# Patient Record
Sex: Female | Born: 1955 | Race: White | Hispanic: No | Smoking: Never smoker
Health system: Southern US, Community
[De-identification: ages and names within clinical notes are randomized; demographics above are authoritative.]

## PROBLEM LIST (undated history)

## (undated) DIAGNOSIS — I10 Essential (primary) hypertension: Secondary | ICD-10-CM

## (undated) DIAGNOSIS — K5790 Diverticulosis of intestine, part unspecified, without perforation or abscess without bleeding: Secondary | ICD-10-CM

## (undated) DIAGNOSIS — J45909 Unspecified asthma, uncomplicated: Secondary | ICD-10-CM

## (undated) DIAGNOSIS — Z853 Personal history of malignant neoplasm of breast: Secondary | ICD-10-CM

## (undated) DIAGNOSIS — E7800 Pure hypercholesterolemia, unspecified: Secondary | ICD-10-CM

## (undated) HISTORY — DX: Personal history of malignant neoplasm of breast: Z85.3

## (undated) SURGERY — ERCP, WITH INTERVENTION IF INDICATED
Anesthesia: Monitor Anesthesia Care

---

## 2003-06-04 DIAGNOSIS — Z853 Personal history of malignant neoplasm of breast: Secondary | ICD-10-CM

## 2003-06-04 HISTORY — DX: Personal history of malignant neoplasm of breast: Z85.3

## 2003-06-04 HISTORY — PX: BILATERAL TOTAL MASTECTOMY WITH AXILLARY LYMPH NODE DISSECTION: SHX6364

## 2014-12-12 HISTORY — PX: CHOLECYSTECTOMY: SHX55

## 2014-12-16 ENCOUNTER — Inpatient Hospital Stay (HOSPITAL_COMMUNITY)
Admission: EM | Admit: 2014-12-16 | Discharge: 2014-12-20 | DRG: 393 | Disposition: A | Discharge status: Rehabilitation Facility | Payer: Medicaid Other | Source: Other Acute Inpatient Hospital | Attending: Internal Medicine | Admitting: Internal Medicine

## 2014-12-16 DIAGNOSIS — Z825 Family history of asthma and other chronic lower respiratory diseases: Secondary | ICD-10-CM

## 2014-12-16 DIAGNOSIS — Z9049 Acquired absence of other specified parts of digestive tract: Secondary | ICD-10-CM | POA: Diagnosis present

## 2014-12-16 DIAGNOSIS — K913 Postprocedural intestinal obstruction: Secondary | ICD-10-CM | POA: Diagnosis present

## 2014-12-16 DIAGNOSIS — J45909 Unspecified asthma, uncomplicated: Secondary | ICD-10-CM | POA: Diagnosis present

## 2014-12-16 DIAGNOSIS — E780 Pure hypercholesterolemia: Secondary | ICD-10-CM | POA: Diagnosis present

## 2014-12-16 DIAGNOSIS — K838 Other specified diseases of biliary tract: Secondary | ICD-10-CM | POA: Diagnosis present

## 2014-12-16 DIAGNOSIS — K9189 Other postprocedural complications and disorders of digestive system: Principal | ICD-10-CM | POA: Diagnosis present

## 2014-12-16 DIAGNOSIS — R52 Pain, unspecified: Secondary | ICD-10-CM

## 2014-12-16 DIAGNOSIS — R0902 Hypoxemia: Secondary | ICD-10-CM | POA: Diagnosis present

## 2014-12-16 DIAGNOSIS — J9811 Atelectasis: Secondary | ICD-10-CM | POA: Diagnosis present

## 2014-12-16 DIAGNOSIS — I10 Essential (primary) hypertension: Secondary | ICD-10-CM | POA: Diagnosis present

## 2014-12-16 DIAGNOSIS — K7689 Other specified diseases of liver: Secondary | ICD-10-CM | POA: Diagnosis present

## 2014-12-16 DIAGNOSIS — R5381 Other malaise: Secondary | ICD-10-CM | POA: Diagnosis not present

## 2014-12-16 DIAGNOSIS — D62 Acute posthemorrhagic anemia: Secondary | ICD-10-CM | POA: Diagnosis not present

## 2014-12-16 DIAGNOSIS — K929 Disease of digestive system, unspecified: Secondary | ICD-10-CM | POA: Diagnosis not present

## 2014-12-16 DIAGNOSIS — K653 Choleperitonitis: Secondary | ICD-10-CM | POA: Diagnosis present

## 2014-12-16 DIAGNOSIS — E876 Hypokalemia: Secondary | ICD-10-CM | POA: Diagnosis present

## 2014-12-16 DIAGNOSIS — I509 Heart failure, unspecified: Secondary | ICD-10-CM | POA: Diagnosis not present

## 2014-12-16 DIAGNOSIS — K567 Ileus, unspecified: Secondary | ICD-10-CM | POA: Diagnosis present

## 2014-12-16 HISTORY — DX: Pure hypercholesterolemia, unspecified: E78.00

## 2014-12-16 HISTORY — DX: Unspecified asthma, uncomplicated: J45.909

## 2014-12-16 HISTORY — DX: Diverticulosis of intestine, part unspecified, without perforation or abscess without bleeding: K57.90

## 2014-12-16 HISTORY — DX: Essential (primary) hypertension: I10

## 2014-12-16 MED ORDER — CHLORHEXIDINE GLUCONATE 0.12 % MT SOLN
15.0000 mL | Freq: Two times a day (BID) | OROMUCOSAL | Status: DC
  Administered 2014-12-17 – 2014-12-20 (×5): 15 mL via OROMUCOSAL
  Filled 2014-12-16 (×11): qty 15

## 2014-12-16 MED ORDER — CETYLPYRIDINIUM CHLORIDE 0.05 % MT LIQD
7.0000 mL | Freq: Two times a day (BID) | OROMUCOSAL | Status: DC
  Administered 2014-12-18 – 2014-12-19 (×3): 7 mL via OROMUCOSAL

## 2014-12-16 NOTE — Progress Notes (Signed)
Pt arrived to 3s via [facility]. NS infusing at 125cc/hr. Pt. Alert and Oriented x4. VSS on 2LNC. Perc drained 200cc brown fluid. Glasses at bedside. Pt. C/o pain to the abdomen 9/10. Awaiting attending MD orders. Will continue to monitor Pt.

## 2014-12-17 ENCOUNTER — Encounter (HOSPITAL_COMMUNITY)
Admission: EM | Disposition: A | Discharge status: Rehabilitation Facility | Payer: Self-pay | Source: Other Acute Inpatient Hospital | Attending: Internal Medicine

## 2014-12-17 ENCOUNTER — Encounter (HOSPITAL_COMMUNITY): Payer: Self-pay | Admitting: Internal Medicine

## 2014-12-17 ENCOUNTER — Inpatient Hospital Stay (HOSPITAL_COMMUNITY): Payer: Medicaid Other | Admitting: Anesthesiology

## 2014-12-17 ENCOUNTER — Inpatient Hospital Stay (HOSPITAL_COMMUNITY): Payer: Medicaid Other

## 2014-12-17 DIAGNOSIS — K913 Postprocedural intestinal obstruction: Secondary | ICD-10-CM

## 2014-12-17 DIAGNOSIS — R0902 Hypoxemia: Secondary | ICD-10-CM | POA: Insufficient documentation

## 2014-12-17 DIAGNOSIS — I10 Essential (primary) hypertension: Secondary | ICD-10-CM

## 2014-12-17 DIAGNOSIS — K9189 Other postprocedural complications and disorders of digestive system: Secondary | ICD-10-CM

## 2014-12-17 DIAGNOSIS — K567 Ileus, unspecified: Secondary | ICD-10-CM | POA: Diagnosis present

## 2014-12-17 HISTORY — PX: ERCP: SHX5425

## 2014-12-17 LAB — COMPREHENSIVE METABOLIC PANEL
ALBUMIN: 1.9 g/dL — AB (ref 3.5–5.0)
ALK PHOS: 60 U/L (ref 38–126)
ALT: 10 U/L — AB (ref 14–54)
ANION GAP: 8 (ref 5–15)
AST: 14 U/L — ABNORMAL LOW (ref 15–41)
BUN: 12 mg/dL (ref 6–20)
CO2: 29 mmol/L (ref 22–32)
Calcium: 7.9 mg/dL — ABNORMAL LOW (ref 8.9–10.3)
Chloride: 102 mmol/L (ref 101–111)
Creatinine, Ser: 0.65 mg/dL (ref 0.44–1.00)
GFR calc non Af Amer: >60 mL/min (ref >60)
GLUCOSE: 117 mg/dL — AB (ref 65–99)
POTASSIUM: 3.1 mmol/L — AB (ref 3.5–5.1)
SODIUM: 139 mmol/L (ref 135–145)
TOTAL PROTEIN: 5.2 g/dL — AB (ref 6.5–8.1)
Total Bilirubin: 3.7 mg/dL — ABNORMAL HIGH (ref 0.3–1.2)

## 2014-12-17 LAB — GLUCOSE, CAPILLARY
GLUCOSE-CAPILLARY: 127 mg/dL — AB (ref 65–99)
Glucose-Capillary: 110 mg/dL — ABNORMAL HIGH (ref 65–99)
Glucose-Capillary: 120 mg/dL — ABNORMAL HIGH (ref 65–99)
Glucose-Capillary: 99 mg/dL (ref 65–99)

## 2014-12-17 LAB — CBC WITH DIFFERENTIAL/PLATELET
Basophils Absolute: 0 10*3/uL (ref 0.0–0.1)
Basophils Relative: 0 % (ref 0–1)
EOS ABS: 0 10*3/uL (ref 0.0–0.7)
Eosinophils Relative: 0 % (ref 0–5)
HCT: 39.5 % (ref 36.0–46.0)
Hemoglobin: 13.7 g/dL (ref 12.0–15.0)
LYMPHS ABS: 0.7 10*3/uL (ref 0.7–4.0)
Lymphocytes Relative: 5 % — ABNORMAL LOW (ref 12–46)
MCH: 31.4 pg (ref 26.0–34.0)
MCHC: 34.7 g/dL (ref 30.0–36.0)
MCV: 90.6 fL (ref 78.0–100.0)
MONO ABS: 1.2 10*3/uL — AB (ref 0.1–1.0)
MONOS PCT: 9 % (ref 3–12)
NEUTROS ABS: 11.1 10*3/uL — AB (ref 1.7–7.7)
NEUTROS PCT: 86 % — AB (ref 43–77)
PLATELETS: 193 10*3/uL (ref 150–400)
RBC: 4.36 MIL/uL (ref 3.87–5.11)
RDW: 13.8 % (ref 11.5–15.5)
WBC: 13 10*3/uL — ABNORMAL HIGH (ref 4.0–10.5)

## 2014-12-17 LAB — PHOSPHORUS: Phosphorus: 2.1 mg/dL — ABNORMAL LOW (ref 2.5–4.6)

## 2014-12-17 LAB — MAGNESIUM: MAGNESIUM: 2 mg/dL (ref 1.7–2.4)

## 2014-12-17 LAB — MRSA PCR SCREENING: MRSA by PCR: NEGATIVE

## 2014-12-17 LAB — URINALYSIS, ROUTINE W REFLEX MICROSCOPIC
Glucose, UA: NEGATIVE mg/dL
Ketones, ur: 40 mg/dL — AB
Nitrite: NEGATIVE
Protein, ur: 100 mg/dL — AB
SPECIFIC GRAVITY, URINE: 1.036 — AB (ref 1.005–1.030)
Urobilinogen, UA: 0.2 mg/dL (ref 0.0–1.0)
pH: 6 (ref 5.0–8.0)

## 2014-12-17 LAB — TSH: TSH: 3.701 u[IU]/mL (ref 0.350–4.500)

## 2014-12-17 LAB — URINE MICROSCOPIC-ADD ON

## 2014-12-17 LAB — GRAM STAIN: Gram Stain: NONE SEEN

## 2014-12-17 LAB — TROPONIN I
TROPONIN I: 0.03 ng/mL (ref <0.031)
Troponin I: 0.08 ng/mL — ABNORMAL HIGH (ref <0.031)
Troponin I: 0.18 ng/mL — ABNORMAL HIGH (ref <0.031)

## 2014-12-17 LAB — LIPASE, BLOOD: Lipase: 20 U/L — ABNORMAL LOW (ref 22–51)

## 2014-12-17 LAB — LACTIC ACID, PLASMA: Lactic Acid, Venous: 1.3 mmol/L (ref 0.5–2.0)

## 2014-12-17 LAB — PROTIME-INR
INR: 1.15 (ref 0.00–1.49)
Prothrombin Time: 14.9 seconds (ref 11.6–15.2)

## 2014-12-17 SURGERY — ERCP, WITH INTERVENTION IF INDICATED
Anesthesia: General

## 2014-12-17 MED ORDER — SODIUM CHLORIDE 0.9 % IV SOLN
INTRAVENOUS | Status: DC
  Administered 2014-12-17: 02:00:00 via INTRAVENOUS

## 2014-12-17 MED ORDER — LACTATED RINGERS IV SOLN
INTRAVENOUS | Status: DC | PRN
  Administered 2014-12-17: 12:00:00 via INTRAVENOUS

## 2014-12-17 MED ORDER — PROPOFOL 10 MG/ML IV BOLUS
INTRAVENOUS | Status: DC | PRN
  Administered 2014-12-17: 100 mg via INTRAVENOUS

## 2014-12-17 MED ORDER — PANTOPRAZOLE SODIUM 40 MG IV SOLR
40.0000 mg | Freq: Every day | INTRAVENOUS | Status: DC
  Administered 2014-12-17 – 2014-12-18 (×3): 40 mg via INTRAVENOUS
  Filled 2014-12-17 (×5): qty 40

## 2014-12-17 MED ORDER — POTASSIUM CHLORIDE 10 MEQ/100ML IV SOLN
10.0000 meq | INTRAVENOUS | Status: DC
  Administered 2014-12-17 (×3): 10 meq via INTRAVENOUS
  Filled 2014-12-17 (×2): qty 100

## 2014-12-17 MED ORDER — FUROSEMIDE 10 MG/ML IJ SOLN
20.0000 mg | Freq: Once | INTRAMUSCULAR | Status: AC
  Administered 2014-12-17: 20 mg via INTRAVENOUS
  Filled 2014-12-17: qty 2

## 2014-12-17 MED ORDER — SODIUM CHLORIDE 0.9 % IJ SOLN
3.0000 mL | Freq: Two times a day (BID) | INTRAMUSCULAR | Status: DC
  Administered 2014-12-17 – 2014-12-19 (×4): 3 mL via INTRAVENOUS

## 2014-12-17 MED ORDER — SUCCINYLCHOLINE CHLORIDE 20 MG/ML IJ SOLN
INTRAMUSCULAR | Status: DC | PRN
  Administered 2014-12-17: 80 mg via INTRAVENOUS

## 2014-12-17 MED ORDER — LIDOCAINE HCL (CARDIAC) 20 MG/ML IV SOLN
INTRAVENOUS | Status: DC | PRN
  Administered 2014-12-17: 100 mg via INTRAVENOUS

## 2014-12-17 MED ORDER — SODIUM CHLORIDE 0.9 % IV SOLN
INTRAVENOUS | Status: DC
  Administered 2014-12-17 – 2014-12-18 (×3): via INTRAVENOUS

## 2014-12-17 MED ORDER — SODIUM CHLORIDE 0.9 % IV SOLN
INTRAVENOUS | Status: DC | PRN
  Administered 2014-12-17: 15 mL

## 2014-12-17 MED ORDER — ACETAMINOPHEN 325 MG PO TABS
650.0000 mg | ORAL_TABLET | Freq: Four times a day (QID) | ORAL | Status: DC | PRN
  Administered 2014-12-17: 650 mg via ORAL
  Filled 2014-12-17: qty 2

## 2014-12-17 MED ORDER — ALBUTEROL SULFATE (2.5 MG/3ML) 0.083% IN NEBU
3.0000 mL | INHALATION_SOLUTION | Freq: Four times a day (QID) | RESPIRATORY_TRACT | Status: DC | PRN
Start: 2014-12-17 — End: 2014-12-20

## 2014-12-17 MED ORDER — HYDROMORPHONE HCL 1 MG/ML IJ SOLN
0.5000 mg | INTRAMUSCULAR | Status: DC | PRN
  Administered 2014-12-17 – 2014-12-20 (×13): 0.5 mg via INTRAVENOUS
  Filled 2014-12-17 (×13): qty 1

## 2014-12-17 MED ORDER — METOCLOPRAMIDE HCL 5 MG/ML IJ SOLN
5.0000 mg | Freq: Three times a day (TID) | INTRAMUSCULAR | Status: DC
Start: 2014-12-17 — End: 2014-12-17
  Administered 2014-12-17 (×2): 5 mg via INTRAVENOUS
  Filled 2014-12-17 (×5): qty 1

## 2014-12-17 MED ORDER — SODIUM CHLORIDE 0.9 % IV SOLN: INTRAVENOUS | Status: DC

## 2014-12-17 MED ORDER — ONDANSETRON HCL 4 MG/2ML IJ SOLN: 4.0000 mg | Freq: Four times a day (QID) | INTRAMUSCULAR | Status: DC | PRN

## 2014-12-17 MED ORDER — ASPIRIN 300 MG RE SUPP
150.0000 mg | Freq: Every day | RECTAL | Status: DC
  Filled 2014-12-17 (×2): qty 1

## 2014-12-17 MED ORDER — PIPERACILLIN-TAZOBACTAM 3.375 G IVPB 30 MIN
3.3750 g | Freq: Once | INTRAVENOUS | Status: AC
  Administered 2014-12-17: 3.375 g via INTRAVENOUS
  Filled 2014-12-17: qty 50

## 2014-12-17 MED ORDER — PHENYLEPHRINE HCL 10 MG/ML IJ SOLN
10.0000 mg | INTRAVENOUS | Status: DC | PRN
  Administered 2014-12-17: 60 ug/min via INTRAVENOUS

## 2014-12-17 MED ORDER — HYDROMORPHONE HCL 1 MG/ML IJ SOLN
0.5000 mg | INTRAMUSCULAR | Status: DC | PRN
  Administered 2014-12-17 (×2): 0.5 mg via INTRAVENOUS

## 2014-12-17 MED ORDER — HYDROMORPHONE HCL 1 MG/ML IJ SOLN
INTRAMUSCULAR | Status: AC
  Administered 2014-12-17: 0.5 mg via INTRAVENOUS
  Filled 2014-12-17: qty 1

## 2014-12-17 MED ORDER — ONDANSETRON HCL 4 MG PO TABS: 4.0000 mg | ORAL_TABLET | Freq: Four times a day (QID) | ORAL | Status: DC | PRN

## 2014-12-17 MED ORDER — PIPERACILLIN-TAZOBACTAM 3.375 G IVPB
3.3750 g | Freq: Three times a day (TID) | INTRAVENOUS | Status: DC
  Administered 2014-12-17 – 2014-12-20 (×11): 3.375 g via INTRAVENOUS
  Filled 2014-12-17 (×16): qty 50

## 2014-12-17 MED ORDER — ONDANSETRON HCL 4 MG/2ML IJ SOLN
INTRAMUSCULAR | Status: DC | PRN
  Administered 2014-12-17: 4 mg via INTRAVENOUS

## 2014-12-17 MED ORDER — HYDROMORPHONE HCL 1 MG/ML IJ SOLN: 0.5000 mg | INTRAMUSCULAR | Status: DC | PRN

## 2014-12-17 MED ORDER — ACETAMINOPHEN 650 MG RE SUPP
650.0000 mg | Freq: Four times a day (QID) | RECTAL | Status: DC | PRN
  Filled 2014-12-17: qty 1

## 2014-12-17 MED ORDER — HYDRALAZINE HCL 20 MG/ML IJ SOLN: 10.0000 mg | Freq: Four times a day (QID) | INTRAMUSCULAR | Status: DC | PRN

## 2014-12-17 MED ORDER — LACTATED RINGERS IV SOLN
INTRAVENOUS | Status: DC
  Administered 2014-12-17: 11:00:00 via INTRAVENOUS

## 2014-12-17 MED ORDER — HEPARIN SODIUM (PORCINE) 5000 UNIT/ML IJ SOLN
5000.0000 [IU] | Freq: Three times a day (TID) | INTRAMUSCULAR | Status: DC
  Administered 2014-12-17 – 2014-12-20 (×10): 5000 [IU] via SUBCUTANEOUS
  Filled 2014-12-17 (×15): qty 1

## 2014-12-17 MED ORDER — LABETALOL HCL 5 MG/ML IV SOLN: 10.0000 mg | INTRAVENOUS | Status: DC | PRN

## 2014-12-17 MED ORDER — METOPROLOL TARTRATE 1 MG/ML IV SOLN: 5.0000 mg | INTRAVENOUS | Status: DC | PRN

## 2014-12-17 MED ORDER — ONDANSETRON HCL 4 MG/2ML IJ SOLN: 4.0000 mg | Freq: Once | INTRAMUSCULAR | Status: AC | PRN

## 2014-12-17 NOTE — Anesthesia Postprocedure Evaluation (Signed)
  Anesthesia Post-op Note  Patient: Valerie Sellers  Procedure(s) Performed: Procedure(s): ENDOSCOPIC RETROGRADE CHOLANGIOPANCREATOGRAPHY (ERCP) (N/A)  Patient Location: PACU  Anesthesia Type:General  Level of Consciousness: awake, oriented, sedated and patient cooperative  Airway and Oxygen Therapy: Patient Spontanous Breathing  Post-op Pain: mild  Post-op Assessment: Post-op Vital signs reviewed, Patient's Cardiovascular Status Stable, Respiratory Function Stable, Patent Airway, No signs of Nausea or vomiting and Pain level controlled              Post-op Vital Signs: stable  Last Vitals:  Filed Vitals:   12/17/14 1404  BP: 141/89  Pulse: 100  Temp:   Resp: 17    Complications: No apparent anesthesia complications

## 2014-12-17 NOTE — Anesthesia Procedure Notes (Addendum)
Procedure Name: Intubation Date/Time: 12/17/2014 12:18 PM Performed by: Alanda AmassFRIEDMAN, Aidenn Skellenger A Pre-anesthesia Checklist: Patient identified, Emergency Drugs available, Suction available, Patient being monitored and Timeout performed Patient Re-evaluated:Patient Re-evaluated prior to inductionOxygen Delivery Method: Circle system utilized Preoxygenation: Pre-oxygenation with 100% oxygen Intubation Type: IV induction Laryngoscope Size: Mac and 3 Grade View: Grade III Tube type: Oral Tube size: 7.5 mm Number of attempts: 1 Airway Equipment and Method: Stylet Placement Confirmation: ETT inserted through vocal cords under direct vision,  positive ETCO2 and breath sounds checked- equal and bilateral Secured at: 21 cm Tube secured with: Tape Dental Injury: Teeth and Oropharynx as per pre-operative assessment

## 2014-12-17 NOTE — Progress Notes (Signed)
Patient Demographics:    Valerie Sellers, is a 59 y.o. female, DOB - 04-23-1956, ZOX:[id number]  Admit date - 12/16/2014   Admitting Physician Valerie Doyne, MD  Outpatient Primary MD for the patient is [facility], [facility]  LOS - 1   No chief complaint on file.       Subjective:    Valerie Sellers today has, No headache, No chest pain, mild generalized abdominal pain - No Nausea, No new weakness tingling or numbness, No Cough - SOB.     Assessment  & Plan :     1. Postcholecystectomy biliary leak with a unsuccessful ERCP done at [facility]. Initial surgery for cholecystectomy was on July 11, ERCP was on July 13, underwent biliary drain placement by IR at [facility] on July 14.  Continues to have generalized pain, appears weak and fatigued, continue nothing by mouth, NG tube for postop ileus, IV fluid, pain control, IV Zosyn, GI physician Dr. Loreta Sellers has been consulted by admitting physician last night we wait for her and put. Patient will likely require repeat ERCP.    2. Shortness of breath. Minimal shortness of breath today on exam, stable on 2 L nasal cannula, does have bilateral atelectasis we'll place on IS. We will place on nebulizer treatments as needed. Monitor closely. Does have history of asthma but with no wheezing at this time.   3. EKG with nonspecific changes and mildly elevated troponin. Check echocardiogram to evaluate wall motion, place on aspirin suppository, Lopressor IV with parameters. Currently not a candidate for left heart catheterization etc. due to #1 above. Denies any previous CAD or smoking history, nondiabetic.   4. Dyslipidemia. Statin once able to take orally.      Code Status : Full, prognosis guarded  Family Communication  : None present  Disposition Plan   : Remain in step down for now  Consults  :  GI Dr. Loreta Sellers has been consulted by admitting physician  Procedures  :   Echocardiogram ordered  DVT Prophylaxis  :   Heparin    Lab Results  Component Value Date   PLT 193 12/17/2014    Inpatient Medications  Scheduled Meds: . antiseptic oral rinse  7 mL Mouth Rinse q12n4p  . chlorhexidine  15 mL Mouth Rinse BID  . pantoprazole (PROTONIX) IV  40 mg Intravenous QHS  . piperacillin-tazobactam (ZOSYN)  IV  3.375 g Intravenous 3 times per day  . sodium chloride  3 mL Intravenous Q12H   Continuous Infusions: . sodium chloride 75 mL/hr at 12/17/14 0138   PRN Meds:.acetaminophen **OR** acetaminophen, albuterol, HYDROmorphone (DILAUDID) injection, labetalol, ondansetron **OR** ondansetron (ZOFRAN) IV  Antibiotics  :    Anti-infectives    Start     Dose/Rate Route Frequency Ordered Stop   12/17/14 0600  piperacillin-tazobactam (ZOSYN) IVPB 3.375 g     3.375 g 12.5 mL/hr over 240 Minutes Intravenous 3 times per day 12/17/14 0035     12/17/14 0045  piperacillin-tazobactam (ZOSYN) IVPB 3.375 g     3.375 g 100 mL/hr over 30 Minutes Intravenous  Once 12/17/14 0041 12/17/14 0152        Objective:   Filed Vitals:   12/16/14 2315 12/17/14 0026 12/17/14 0431  BP: 151/65 138/82 132/77  Pulse: 107 107 106  Temp: 97.5 F (36.4 C) 97.8 F (36.6 C) 98.2 F (36.8 C)  TempSrc: Oral Oral Oral  Resp: 23 16 19   Height: 5\' 3"  (1.6 m)    Weight: 67.8 kg (149 lb 7.6 oz)    SpO2: 95% 95% 95%    Wt Readings from Last 3 Encounters:  12/16/14 67.8 kg (149 lb 7.6 oz)     Intake/Output Summary (Last 24 hours) at 12/17/14 1023 Last data filed at 12/17/14 0645  Gross per 24 hour  Intake 383.75 ml  Output    475 ml  Net -91.25 ml     Physical Exam  Awake Alert, Oriented X 3, No new F.N deficits, Normal affect [place].AT,PERRAL Supple Neck,No JVD, No cervical lymphadenopathy appriciated.  Symmetrical Chest wall movement, reduced breath sounds  in both bases RRR,No Gallops,Rubs or new Murmurs, No Parasternal Heave Hypoactive but +ve B.Sounds, Abd Soft, No tenderness, No organomegaly appriciated, No rebound - guarding or rigidity. Right-sided cholecystectomy drain in the abdomen, NG tube in place No Cyanosis, Clubbing or edema, No new Rash or bruise      Data Review:   Micro Results Recent Results (from the past 240 hour(s))  MRSA PCR Screening     Status: None   Collection Time: 12/16/14 11:15 PM  Result Value Ref Range Status   MRSA by PCR NEGATIVE NEGATIVE Final    Comment:        The GeneXpert MRSA Assay (FDA approved for NASAL specimens only), is one component of a comprehensive MRSA colonization surveillance program. It is not intended to diagnose MRSA infection nor to guide or monitor treatment for MRSA infections.   Gram stain     Status: None   Collection Time: 12/17/14  1:25 AM  Result Value Ref Range Status   Specimen Description FLUID BILE  Final   Special Requests NONE  Final   Gram Stain WBCPM NO ORGANISMS SEEN  Final   Report Status 12/17/2014 FINAL  Final    Radiology Reports Dg Chest Port 1 View  12/17/2014   CLINICAL DATA:  History of asthma.  History of hypertension.  EXAM: PORTABLE CHEST - 1 VIEW  COMPARISON:  None.  FINDINGS: Nasogastric tube is in place with tip to level of the distal stomach. Film is made with low lung volumes. Heart size is probably normal. There is small left pleural effusion. Opacity at the left lung base obscures the medial left hemidiaphragm consistent atelectasis or infiltrate. Consider PA and lateral chest if the patient is able.  IMPRESSION: 1. Small left pleural effusion. 2. Left lower lobe atelectasis or infiltrate.   Electronically Signed   By: Norva PavlovElizabeth  Brown M.D.   On: 12/17/2014 08:06   Dg Abd Portable 1v  12/17/2014   CLINICAL DATA:  Hypoxia.  EXAM: PORTABLE ABDOMEN - 1 VIEW  COMPARISON:  Chest x-ray same day  FINDINGS: Single supine image of the abdomen  demonstrates residual contrast in the ascending colon. Bowel gas pattern is nonobstructive. Percutaneous catheter has a right lateral approach in the right upper quadrant. Visualized osseous structures have a normal appearance.  IMPRESSION: 1. Nonobstructive bowel gas pattern. 2. Percutaneous catheter right upper quadrant.   Electronically Signed   By: Norva PavlovElizabeth  Brown M.D.   On: 12/17/2014 08:07     CBC  Recent Labs Lab 12/17/14 0244  WBC 13.0*  HGB 13.7  HCT 39.5  PLT 193  MCV 90.6  MCH 31.4  MCHC 34.7  RDW 13.8  LYMPHSABS  0.7  MONOABS 1.2*  EOSABS 0.0  BASOSABS 0.0    Chemistries   Recent Labs Lab 12/17/14 0244  NA 139  K 3.1*  CL 102  CO2 29  GLUCOSE 117*  BUN 12  CREATININE 0.65  CALCIUM 7.9*  MG 2.0  AST 14*  ALT 10*  ALKPHOS 60  BILITOT 3.7*   ------------------------------------------------------------------------------------------------------------------ estimated creatinine clearance is 68.3 mL/min (by C-G formula based on Cr of 0.65). ------------------------------------------------------------------------------------------------------------------ No results for input(s): HGBA1C in the last 72 hours. ------------------------------------------------------------------------------------------------------------------ No results for input(s): CHOL, HDL, LDLCALC, TRIG, CHOLHDL, LDLDIRECT in the last 72 hours. ------------------------------------------------------------------------------------------------------------------  Recent Labs  12/17/14 0244  TSH 3.701   ------------------------------------------------------------------------------------------------------------------ No results for input(s): VITAMINB12, FOLATE, FERRITIN, TIBC, IRON, RETICCTPCT in the last 72 hours.  Coagulation profile  Recent Labs Lab 12/17/14 0244  INR 1.15    No results for input(s): DDIMER in the last 72 hours.  Cardiac Enzymes  Recent Labs Lab 12/17/14 0244  12/17/14 0747  TROPONINI 0.08* 0.18*   ------------------------------------------------------------------------------------------------------------------ Invalid input(s): POCBNP   Time Spent in minutes  35   Erroll Wilbourne K M.D on 12/17/2014 at 10:23 AM  Between 7am to 7pm - Pager - [phone]  After 7pm go to [email or web address] - password [facility] S[facility]  [facility]   Office  [phone]

## 2014-12-17 NOTE — Op Note (Signed)
[facility] [place], [address]   ERCP PROCEDURE REPORT  PATIENT: Valerie Sellers, Valerie Sellers  MR# :[id number] BIRTHDATE: 1956-02-25  GENDER: female ENDOSCOPIST: Clarene Essex, MD REFERRED BY: PROCEDURE DATE:  12/17/2014 PROCEDURE:   ERCP with sphincterotomy/papillotomy and ERCP with stent placement ASA CLASS:   3 INDICATIONS: bile leak MEDICATIONS:    Gen. anesthesia TOPICAL ANESTHETIC:  DESCRIPTION OF PROCEDURE:   After the risks benefits and alternatives of the procedure were thoroughly explained, informed consent was obtained.  The Pentax ERCP [id number]  endoscope was introduced through the mouth and advanced to the second portion of the duodenum .a bulbus ampulla was brought into view and using the triple lumen sphincterotome loaded with the JAG Jagwire on 2 initial cannulations the wire went towards the pancreas but then in repositioning the sphincterotome wire Going through a false channel and we switched to this smaller sphincterotome and the smaller wire but met the same fate and no dye was injected into the pancreas and we did not advance to wire towards pancreas anymore and after our prolonged effort we went ahead and proceeded with needle knife sphincterotomy in the customary fashion and deep selective cannulation was obtained and the CBD and intrahepatics were opacified and no stones and no obvious leak was seen and the wire was placed in the intrahepatics and the needle-knife sphincterotome was removed and the regular sphincterotome was inserted and more dye was injected without additional findings and we went ahead and increased a sphincterotomy in the customary fashion and then went ahead and placed a 10 French 7 cm stent in the proper position with excellent biliary drainage and the scope was removed and the patient tolerated the procedure well there was no obvious immediate complication Estimated blood loss is zero unless otherwise noted  in this procedure report.       COMPLICATIONS:none  ENDOSCOPIC IMPRESSION:1. Bulbous ampulla 2. 2 pancreatic duct wire advancements without any injections into the pancreas 3. False channel in the ampulla 4.needle-knife sphincterotomy regular sphincterotomyand stent placement as above with excellent biliary drainage  RECOMMENDATIONS:customary post-ERCP orders observe for delayed complication if none continue postop care per surgery team and I'm happy to see back in the office in one month and set up stent removal or her doctor in [place] can do that     _______________________________ eSigned:  Clarene Essex, MD 12/17/2014 1:45 PM   CC:

## 2014-12-17 NOTE — Anesthesia Preprocedure Evaluation (Addendum)
Anesthesia Evaluation  Patient identified by MRN, date of birth, ID band Patient awake    Reviewed: Allergy & Precautions, NPO status , Patient's Chart, lab work & pertinent test results, reviewed documented beta blocker date and time   Airway Mallampati: III  TM Distance: >3 FB Neck ROM: Full    Dental  (+) Edentulous Upper, Edentulous Lower, Dental Advisory Given   Pulmonary asthma ,  + rhonchi   + decreased breath sounds      Cardiovascular hypertension, Pt. on medications and Pt. on home beta blockers Normal cardiovascular exam    Neuro/Psych    GI/Hepatic Biliary leak    Endo/Other    Renal/GU      Musculoskeletal   Abdominal   Peds  Hematology   Anesthesia Other Findings   Reproductive/Obstetrics                            Anesthesia Physical Anesthesia Plan  ASA: III  Anesthesia Plan: General   Post-op Pain Management:    Induction: Intravenous  Airway Management Planned: Oral ETT  Additional Equipment:   Intra-op Plan:   Post-operative Plan: Extubation in OR  Informed Consent: I have reviewed the patients History and Physical, chart, labs and discussed the procedure including the risks, benefits and alternatives for the proposed anesthesia with the patient or authorized representative who has indicated his/her understanding and acceptance.     Plan Discussed with: CRNA, Anesthesiologist and Surgeon  Anesthesia Plan Comments:         Anesthesia Quick Evaluation

## 2014-12-17 NOTE — Transfer of Care (Signed)
Immediate Anesthesia Transfer of Care Note  Patient: Valerie Sellers  Procedure(s) Performed: Procedure(s): ENDOSCOPIC RETROGRADE CHOLANGIOPANCREATOGRAPHY (ERCP) (N/A)  Patient Location: PACU  Anesthesia Type:General  Level of Consciousness: sedated  Airway & Oxygen Therapy: Patient Spontanous Breathing and Patient connected to nasal cannula oxygen  Post-op Assessment: Report given to RN and Post -op Vital signs reviewed and stable  Post vital signs: Reviewed and stable  Last Vitals:  Filed Vitals:   12/17/14 1120  BP: 152/82  Pulse: 105  Temp:   Resp: 22    Complications: No apparent anesthesia complications

## 2014-12-17 NOTE — Consult Note (Signed)
Cross cover [facility] Reason for Consult: Needs an ERCP for bile leak. Referring Physician: ER-MD  Valerie Sellers is an 59 y.o. female.  HPI: 30 year white female had a laparoscopic colonoscopy done on 12/12/14, developed RUQ pain 2 days after surgery-found to have a bile leak on a HIDA scan after which an ERCP was attempted by Dr. Signe Colt but he was unable to put a stent in. The cholangiogram was reportedly normal. She apparently became tachycardic after this and was transferred to the ICU. She subsequently had a drain placed by IR. As she continued to have high output from her drain, an repeat ERCP was requested.     Past Medical History  Diagnosis Date  . Hypertension   . Hypercholesteremia   . Asthma   . Diverticulosis    Past Surgical History  Procedure Laterality Date  . Cholecystectomy  12/12/2014   Family History  Problem Relation Age of Onset  . Stomach cancer Mother   . Asthma Father   . Cancer Father   . Lung cancer Brother    Social History:  reports that she has never smoked. She does not have any smokeless tobacco history on file. She reports that she does not drink alcohol or use illicit drugs.  Allergies: No Known Allergies  Medications: I have reviewed the patient's current medications.  Results for orders placed or performed during the hospital encounter of 12/16/14 (from the past 48 hour(s))  MRSA PCR Screening     Status: None   Collection Time: 12/16/14 11:15 PM  Result Value Ref Range   MRSA by PCR NEGATIVE NEGATIVE    Comment:        The GeneXpert MRSA Assay (FDA approved for NASAL specimens only), is one component of a comprehensive MRSA colonization surveillance program. It is not intended to diagnose MRSA infection nor to guide or monitor treatment for MRSA infections.   Glucose, capillary     Status: Abnormal   Collection Time: 12/17/14 12:28 AM  Result Value Ref Range   Glucose-Capillary 127 (H) 65 - 99 mg/dL   Comment 1 Notify RN   Gram  stain     Status: None   Collection Time: 12/17/14  1:25 AM  Result Value Ref Range   Specimen Description FLUID BILE    Special Requests NONE    Gram Stain WBCPM NO ORGANISMS SEEN    Report Status 12/17/2014 FINAL   CBC with Differential/Platelet     Status: Abnormal   Collection Time: 12/17/14  2:44 AM  Result Value Ref Range   WBC 13.0 (H) 4.0 - 10.5 K/uL   RBC 4.36 3.87 - 5.11 MIL/uL   Hemoglobin 13.7 12.0 - 15.0 g/dL   HCT 39.5 36.0 - 46.0 %   MCV 90.6 78.0 - 100.0 fL   MCH 31.4 26.0 - 34.0 pg   MCHC 34.7 30.0 - 36.0 g/dL   RDW 13.8 11.5 - 15.5 %   Platelets 193 150 - 400 K/uL   Neutrophils Relative % 86 (H) 43 - 77 %   Neutro Abs 11.1 (H) 1.7 - 7.7 K/uL   Lymphocytes Relative 5 (L) 12 - 46 %   Lymphs Abs 0.7 0.7 - 4.0 K/uL   Monocytes Relative 9 3 - 12 %   Monocytes Absolute 1.2 (H) 0.1 - 1.0 K/uL   Eosinophils Relative 0 0 - 5 %   Eosinophils Absolute 0.0 0.0 - 0.7 K/uL   Basophils Relative 0 0 - 1 %   Basophils Absolute  0.0 0.0 - 0.1 K/uL  Comprehensive metabolic panel     Status: Abnormal   Collection Time: 12/17/14  2:44 AM  Result Value Ref Range   Sodium 139 135 - 145 mmol/L   Potassium 3.1 (L) 3.5 - 5.1 mmol/L   Chloride 102 101 - 111 mmol/L   CO2 29 22 - 32 mmol/L   Glucose, Bld 117 (H) 65 - 99 mg/dL   BUN 12 6 - 20 mg/dL   Creatinine, Ser 0.65 0.44 - 1.00 mg/dL   Calcium 7.9 (L) 8.9 - 10.3 mg/dL   Total Protein 5.2 (L) 6.5 - 8.1 g/dL   Albumin 1.9 (L) 3.5 - 5.0 g/dL   AST 14 (L) 15 - 41 U/L   ALT 10 (L) 14 - 54 U/L   Alkaline Phosphatase 60 38 - 126 U/L   Total Bilirubin 3.7 (H) 0.3 - 1.2 mg/dL   GFR calc non Af Amer >60 >60 mL/min   GFR calc Af Amer >60 >60 mL/min    Comment: (NOTE) The eGFR has been calculated using the CKD EPI equation. This calculation has not been validated in all clinical situations. eGFR's persistently <60 mL/min signify possible Chronic Kidney Disease.    Anion gap 8 5 - 15  Protime-INR     Status: None   Collection Time:  12/17/14  2:44 AM  Result Value Ref Range   Prothrombin Time 14.9 11.6 - 15.2 seconds   INR 1.15 0.00 - 1.49  Magnesium     Status: None   Collection Time: 12/17/14  2:44 AM  Result Value Ref Range   Magnesium 2.0 1.7 - 2.4 mg/dL  Phosphorus     Status: Abnormal   Collection Time: 12/17/14  2:44 AM  Result Value Ref Range   Phosphorus 2.1 (L) 2.5 - 4.6 mg/dL  TSH     Status: None   Collection Time: 12/17/14  2:44 AM  Result Value Ref Range   TSH 3.701 0.350 - 4.500 uIU/mL  Lactic acid, plasma     Status: None   Collection Time: 12/17/14  2:44 AM  Result Value Ref Range   Lactic Acid, Venous 1.3 0.5 - 2.0 mmol/L  Lipase, blood     Status: Abnormal   Collection Time: 12/17/14  2:44 AM  Result Value Ref Range   Lipase 20 (L) 22 - 51 U/L  Troponin I (q 6hr x 3)     Status: Abnormal   Collection Time: 12/17/14  2:44 AM  Result Value Ref Range   Troponin I 0.08 (H) <0.031 ng/mL    Comment:        PERSISTENTLY INCREASED TROPONIN VALUES IN THE RANGE OF 0.04-0.49 ng/mL CAN BE SEEN IN:       -UNSTABLE ANGINA       -CONGESTIVE HEART FAILURE       -MYOCARDITIS       -CHEST TRAUMA       -ARRYHTHMIAS       -LATE PRESENTING MYOCARDIAL INFARCTION       -COPD   CLINICAL FOLLOW-UP RECOMMENDED.   Glucose, capillary     Status: Abnormal   Collection Time: 12/17/14  4:33 AM  Result Value Ref Range   Glucose-Capillary 110 (H) 65 - 99 mg/dL   Comment 1 Notify RN   Urinalysis, Routine w reflex microscopic (not at [facility])     Status: Abnormal   Collection Time: 12/17/14  4:45 AM  Result Value Ref Range   Color, Urine AMBER (A) YELLOW  Comment: BIOCHEMICALS MAY BE AFFECTED BY COLOR   APPearance CLOUDY (A) CLEAR   Specific Gravity, Urine 1.036 (H) 1.005 - 1.030   pH 6.0 5.0 - 8.0   Glucose, UA NEGATIVE NEGATIVE mg/dL   Hgb urine dipstick MODERATE (A) NEGATIVE   Bilirubin Urine SMALL (A) NEGATIVE   Ketones, ur 40 (A) NEGATIVE mg/dL   Protein, ur 100 (A) NEGATIVE mg/dL   Urobilinogen,  UA 0.2 0.0 - 1.0 mg/dL   Nitrite NEGATIVE NEGATIVE   Leukocytes, UA TRACE (A) NEGATIVE  Urine microscopic-add on     Status: Abnormal   Collection Time: 12/17/14  4:45 AM  Result Value Ref Range   Squamous Epithelial / LPF FEW (A) RARE   WBC, UA 3-6 <3 WBC/hpf   RBC / HPF 7-10 <3 RBC/hpf   Bacteria, UA RARE RARE   Urine-Other AMORPHOUS URATES/PHOSPHATES   Troponin I (q 6hr x 3)     Status: Abnormal   Collection Time: 12/17/14  7:47 AM  Result Value Ref Range   Troponin I 0.18 (H) <0.031 ng/mL    Comment:        PERSISTENTLY INCREASED TROPONIN VALUES IN THE RANGE OF 0.04-0.49 ng/mL CAN BE SEEN IN:       -UNSTABLE ANGINA       -CONGESTIVE HEART FAILURE       -MYOCARDITIS       -CHEST TRAUMA       -ARRYHTHMIAS       -LATE PRESENTING MYOCARDIAL INFARCTION       -COPD   CLINICAL FOLLOW-UP RECOMMENDED.   Glucose, capillary     Status: Abnormal   Collection Time: 12/17/14  8:10 AM  Result Value Ref Range   Glucose-Capillary 120 (H) 65 - 99 mg/dL   Dg Chest Port 1 View  12/17/2014   CLINICAL DATA:  History of asthma.  History of hypertension.  EXAM: PORTABLE CHEST - 1 VIEW  COMPARISON:  None.  FINDINGS: Nasogastric tube is in place with tip to level of the distal stomach. Film is made with low lung volumes. Heart size is probably normal. There is small left pleural effusion. Opacity at the left lung base obscures the medial left hemidiaphragm consistent atelectasis or infiltrate. Consider PA and lateral chest if the patient is able.  IMPRESSION: 1. Small left pleural effusion. 2. Left lower lobe atelectasis or infiltrate.   Electronically Signed   By: Nolon Nations M.D.   On: 12/17/2014 08:06   Dg Abd Portable 1v  12/17/2014   CLINICAL DATA:  Hypoxia.  EXAM: PORTABLE ABDOMEN - 1 VIEW  COMPARISON:  Chest x-ray same day  FINDINGS: Single supine image of the abdomen demonstrates residual contrast in the ascending colon. Bowel gas pattern is nonobstructive. Percutaneous catheter has a  right lateral approach in the right upper quadrant. Visualized osseous structures have a normal appearance.  IMPRESSION: 1. Nonobstructive bowel gas pattern. 2. Percutaneous catheter right upper quadrant.   Electronically Signed   By: Nolon Nations M.D.   On: 12/17/2014 08:07   Review of Systems  Constitutional: Positive for chills and malaise/fatigue. Negative for fever, weight loss and diaphoresis.  HENT: Negative.   Eyes: Negative.   Respiratory: Positive for shortness of breath.   Cardiovascular: Negative.   Gastrointestinal: Positive for nausea and abdominal pain. Negative for heartburn, vomiting, diarrhea, constipation, blood in stool and melena.  Genitourinary: Negative.   Musculoskeletal: Negative.   Skin: Negative.   Neurological: Positive for weakness.   Blood pressure 132/77, pulse 106,  temperature 98.2 F (36.8 C), temperature source Oral, resp. rate 19, height _0  (1.6 m), weight 67.8 kg (149 lb 7.6 oz), SpO2 95 %. Physical Exam  Constitutional: She appears well-developed and well-nourished.  HENT:  Head: Normocephalic and atraumatic.  Eyes: Conjunctivae and EOM are normal. Pupils are equal, round, and reactive to light.  Neck: Neck supple.  Cardiovascular: Tachycardia present.   Respiratory: Tachypnea noted.  GI: Soft. She exhibits distension. Bowel sounds are increased. There is tenderness in the right upper quadrant and periumbilical area.  Skin: Skin is warm and dry.   Assessment/Plan: 1) Bile leak s/p laparoscopic cholecystectomy-on Zosyn-ERCP planned this afternoon to be done by Dr. Clarene Essex.  2) HTN,  Valerie Sellers 12/17/2014, 10:09 AM

## 2014-12-17 NOTE — H&P (Addendum)
PCP:  Simone CuriaLEE,KEUNG, MD    Referring provider  Thel[facility]geFrancis [facility]   Chief Complaint: Patient transferred for GI consultation  HPI: Valerie Sellers is a 59 y.o. female   has a past medical history of Hypertension; Hypercholesteremia; Asthma; and Diverticulosis.   On July 11 patient undergone laparoscopic cholecystectomy with intraoperative course angiogram. Once 13 th July [facility] with complaints of upper quadrant pain. HIDA scan showed bile leak. ERCP was done on  13 th  July showed evidence of slight biliary leak of contrast into biliary fossa. Normal cholangiogram and normal pancreatogram. Unfortunately Biliary system was never deeply cannulated and sphincterotomy or stent placement could not be performed On  July 14 patient developed tachypnea and hypertension secondary to significant abdominal pain and bloating. She was transferred to ICU.   IR placed percutaneous drain.   Patient continued to have high output from the drain and GI consult recommended [facility] to [facility] for [facility] with sphincterectomy and stent placement. Case was discussed with Dr. Loreta AveMann who will consult.  Throughout her hospital stay patient apparently remained afebrile. She is on third day of Ancef 1 g every 6 hours. Patient continued to have elevated low blood cell count 18.9.   Patient noted to have abdominal distention and minimal flatness as well as small bowel movements. X-ray was done showing dilated small and large intestine.  She was thought to have significant ileus.   NG tube placed.   Currently she endorses ongoing abdominal pain but states that instead of right upper quadrant pain now her pain is suprapubic and diffuse more centralized. She endorses passing small amount of flatness very small bowel movement.   He has been tachycardic throughout her hospital stay which was thought to be secondary to pain she denies any chest pain no shortness of breath no lower  extremity swelling. Currently appears to be comfortable  Hospitalist was called for admission for bile leak needs GI consult for ERCP  Review of Systems:    Pertinent positives include: abdominal pain  Constitutional:  No weight loss, night sweats, Fevers, chills, fatigue, weight loss  HEENT:  No headaches, Difficulty swallowing,Tooth/dental problems,Sore throat,  No sneezing, itching, ear ache, nasal congestion, post nasal drip,  Cardio-vascular:  No chest pain, Orthopnea, PND, anasarca, dizziness, palpitations.no Bilateral lower extremity swelling  GI:  No heartburn, indigestion, abdominal pain, nausea, vomiting, diarrhea, change in bowel habits, loss of appetite, melena, blood in stool, hematemesis Resp:  no shortness of breath at rest. No dyspnea on exertion, No excess mucus, no productive cough, No non-productive cough, No coughing up of blood.No change in color of mucus.No wheezing. Skin:  no rash or lesions. No jaundice GU:  no dysuria, change in color of urine, no urgency or frequency. No straining to urinate.  No flank pain.  Musculoskeletal:  No joint pain or no joint swelling. No decreased range of motion. No back pain.  Psych:  No change in mood or affect. No depression or anxiety. No memory loss.  Neuro: no localizing neurological complaints, no tingling, no weakness, no double vision, no gait abnormality, no slurred speech, no confusion  Otherwise ROS are negative except for above, 10 systems were reviewed  Past Medical History: Past Medical History  Diagnosis Date  . Hypertension   . Hypercholesteremia   . Asthma   . Diverticulosis    Past Surgical History  Procedure Laterality Date  . Cholecystectomy  12/12/2014     Medications: Prior to Admission medications  Not on File   Please see epic list has been entered  Allergies:  No Known Allergies  Social History:  Ambulatory   Independently  Lives at home With family     reports that she has  never smoked. She does not have any smokeless tobacco history on file. She reports that she does not drink alcohol or use illicit drugs.    Family History: family history includes Asthma in her father; Cancer in her father; Lung cancer in her brother; Stomach cancer in her mother.    Physical Exam: Patient Vitals for the past 24 hrs:  BP Temp Temp src Pulse Resp SpO2  12/16/14 2315 (!) 151/65 mmHg 97.5 F (36.4 C) Oral (!) 107 (!) 23 95 %    1. General:  in No Acute distress 2. Psychological: Alert and   Oriented 3. Head/ENT:   Moist   Mucous Membranes NG tube in place                          Head Non traumatic, neck supple                          Normal  Dentition 4. SKIN: normal   Skin turgor,  Skin clean Dry and intact no rash 5. Heart: Regular rate and rhythm no Murmur, Rub or gallop 6. Lungs: Clear to auscultation bilaterally, no wheezes or crackles   7. Abdomen:  Generalized tenderness, distention decreased bowel sounds 8. Lower extremities: no clubbing, cyanosis, or edema 9. Neurologically Grossly intact, moving all 4 extremities equally 10. MSK: Normal range of motion  body mass index is unknown because there is no height or weight on file.   Labs on Admission:   No results found for this or any previous visit (from the past 24 hour(s)).  UA ordered  No results found for: HGBA1C  CrCl cannot be calculated (Unknown ideal weight.).  BNP (last 3 results) No results for input(s): PROBNP in the last 8760 hours.  Other results:  I have pearsonaly reviewed this: ECG REPORT 104 sinus tachycardia Inverted T waves in septal and lateral leads QTC 476   There were no vitals filed for this visit.   Cultures: No results found for: SDES, SPECREQUEST, CULT, REPTSTATUS   Radiological Exams on Admission: No results found.  Chart has been reviewed  Family not at  Bedside   Assessment/Plan this is 59 year old female with past medical history of hypertension and  frequent UTIs had undergone a cholecystectomy on July 11, there was a [facility] with what appeared to be abdominal pain was found to have bile leak undergone percutaneous drain placement. ERCP was done but was unable to stent. Patient has been febrile but persistently mildly tachycardic. Persistently elevated white blood cell count   Present on Admission:  . Bile leak, postoperative - GI has been consulted recommends keeping patient nothing by mouth for ERCP in the morning. Will obtain CBC and CMET change into biotics to Zosyn for anaerobic coverage, given persistent elevated white blood cell count have ordered cultures of bile drain output. If no improvement on Zosyn will need repeat CT of abdomen to rule out development of abscess.  . Ileus, postoperative - will obtain KUB to evaluate for stool burden and evidence of small bowel obstruction, patient has NG tube in place which has helped with nausea. Will try to minimize narcotics as much as able and start  Reglan . Essential hypertension holding lisinopril while nothing by mouth give blood pedal when necessary  tachycardia -mild but persistent. Patient on 20-7 nasal cannula satting 96%. Does not endorse any chest pain shortness of breath. Most likely tachycardia secondary to pain in the abdomen will attempt to control. PE less likely but if tachycardia persists or unexplained hypoxia could be further worked up with CT angiogram   hypoxia mild patient is on 2 L satting 96%  - will start by obtaining a chest x-ray patient would likely benefit from incentive spirometry  Abnormal ECG - will cycle CE. No old ECG.  Prophylaxis: SCD   and Protonix  Persistent Luekocytosis - evaluate for source of infection, may need repeat CT of the abdomen if does not improve.   CODE STATUS:  FULL CODE  as per patient    Disposition:   To home once workup is complete and patient is stable  Other plan as per orders.  I have spent a total of 65 min  on this admission extra time was spent to discuss case with Dr. Nelida Gores 12/17/2014, 12:08 AM  [facility]  Pager [phone]   after 2 AM please page floor coverage PA If 7AM-7PM, please contact the day team taking care of the patient  [email or web address]  Password [email or web address]

## 2014-12-17 NOTE — Progress Notes (Signed)
Katrine CohoSandra Aaronson 12:01 PM  Subjective: Patient seen and examined and her hospital computer chart was reviewed and discussed with Dr. Loreta AveMann and Dr. Jennye BoroughsMisenheimer who tried an ERCP yesterday and the patient is feeling a little better and says her drainage is a little better and she has no new complaints Objective: Vital signs stable afebrile no acute distress exam please see preassessment evaluation labs and x-rays reviewed  Assessment: Bile leak  Plan: I rediscussed ERCP including the risks benefits and methods with the patient as well as success rate and will try today with anesthesia assistance  [facility],Meila Berke E  Pager [phone] After 5PM or if no answer call [phone]

## 2014-12-18 LAB — MAGNESIUM: Magnesium: 1.9 mg/dL (ref 1.7–2.4)

## 2014-12-18 LAB — CBC
HCT: 37.1 % (ref 36.0–46.0)
Hemoglobin: 12.7 g/dL (ref 12.0–15.0)
MCH: 31.2 pg (ref 26.0–34.0)
MCHC: 34.2 g/dL (ref 30.0–36.0)
MCV: 91.2 fL (ref 78.0–100.0)
Platelets: 162 10*3/uL (ref 150–400)
RBC: 4.07 MIL/uL (ref 3.87–5.11)
RDW: 13.7 % (ref 11.5–15.5)
WBC: 8.3 10*3/uL (ref 4.0–10.5)

## 2014-12-18 LAB — COMPREHENSIVE METABOLIC PANEL
ALBUMIN: 1.7 g/dL — AB (ref 3.5–5.0)
ALK PHOS: 62 U/L (ref 38–126)
ALT: 10 U/L — ABNORMAL LOW (ref 14–54)
ANION GAP: 3 — AB (ref 5–15)
AST: 19 U/L (ref 15–41)
BUN: 13 mg/dL (ref 6–20)
CHLORIDE: 101 mmol/L (ref 101–111)
CO2: 33 mmol/L — ABNORMAL HIGH (ref 22–32)
Calcium: 7.3 mg/dL — ABNORMAL LOW (ref 8.9–10.3)
Creatinine, Ser: 0.57 mg/dL (ref 0.44–1.00)
GFR calc Af Amer: >60 mL/min (ref >60)
GFR calc non Af Amer: >60 mL/min (ref >60)
Glucose, Bld: 127 mg/dL — ABNORMAL HIGH (ref 65–99)
POTASSIUM: 3.1 mmol/L — AB (ref 3.5–5.1)
SODIUM: 137 mmol/L (ref 135–145)
Total Bilirubin: 2.6 mg/dL — ABNORMAL HIGH (ref 0.3–1.2)
Total Protein: 5.4 g/dL — ABNORMAL LOW (ref 6.5–8.1)

## 2014-12-18 LAB — URINE CULTURE: CULTURE: NO GROWTH

## 2014-12-18 MED ORDER — ATORVASTATIN CALCIUM 20 MG PO TABS
20.0000 mg | ORAL_TABLET | Freq: Every day | ORAL | Status: DC
  Administered 2014-12-18 – 2014-12-19 (×2): 20 mg via ORAL
  Filled 2014-12-18 (×4): qty 1

## 2014-12-18 MED ORDER — ASPIRIN 81 MG PO CHEW
81.0000 mg | CHEWABLE_TABLET | Freq: Every day | ORAL | Status: DC
  Administered 2014-12-18 – 2014-12-20 (×3): 81 mg via ORAL
  Filled 2014-12-18 (×3): qty 1

## 2014-12-18 MED ORDER — FUROSEMIDE 10 MG/ML IJ SOLN
20.0000 mg | Freq: Once | INTRAMUSCULAR | Status: AC
  Administered 2014-12-18: 20 mg via INTRAVENOUS
  Filled 2014-12-18: qty 2

## 2014-12-18 MED ORDER — CARVEDILOL 3.125 MG PO TABS
3.1250 mg | ORAL_TABLET | Freq: Two times a day (BID) | ORAL | Status: DC
  Administered 2014-12-18 – 2014-12-20 (×5): 3.125 mg via ORAL
  Filled 2014-12-18 (×8): qty 1

## 2014-12-18 NOTE — Evaluation (Signed)
Physical Therapy Evaluation Patient Details Name: Valerie CohoSandra Fortner MRN: [id number] DOB: 03/28/1956 Today's Date: 12/18/2014   History of Present Illness  Patient is a 59 yo female admitted 12/16/14 with abdominal pain - bile leak post-op.  Patient s/p cholecystectomy 12/12/14.  Patient now s/p ERCP with sphincterotomy/papillotomy and ERCP with stent placement.  PMH:  Hypertension; Hypercholesteremia; Asthma; and Diverticulosis  Clinical Impression  Patient presents with problems listed below.  Will benefit from acute PT to maximize mobility prior to discharge.  Patient was independent pta.  Recommend Inpatient Rehab consult to return patient to highest functional level prior to return home with family.    Follow Up Recommendations CIR;Supervision/Assistance - 24 hour    Equipment Recommendations  Rolling walker with 5" wheels    Recommendations for Other Services OT consult     Precautions / Restrictions Precautions Precautions: Fall Restrictions Weight Bearing Restrictions: No      Mobility  Bed Mobility                  Transfers Overall transfer level: Needs assistance Equipment used: 1 person hand held assist Transfers: Sit to/from Stand Sit to Stand: Min assist         General transfer comment: Cues for safe hand placement and to scoot to edge of chair.  Slow to respond to cueing or to ask for assist when unable to stand initially.  Required assist to power up to standing and for balance.  Ambulation/Gait Ambulation/Gait assistance: Mod assist Ambulation Distance (Feet): 1 Feet Assistive device: 1 person hand held assist Gait Pattern/deviations: Shuffle;Festinating     General Gait Details: Verbal cues to step forward.  Patient unsteady in stance and with attempts to take steps.  LE's shaking.  Difficulty stepping with RLE.  Knees buckling.  Returned to sitting.  Will need +2 assist at next session.  Stairs            Wheelchair Mobility    Modified  Rankin (Stroke Patients Only)       Balance Overall balance assessment: Needs assistance         Standing balance support: Single extremity supported Standing balance-Leahy Scale: Poor                               Pertinent Vitals/Pain Pain Assessment: No/denies pain    Home Living Family/patient expects to be discharged to:: Private residence Living Arrangements: Alone;Children (Patient reports daughter, son/girlfriend and 2 grandchildren) Available Help at Discharge: Family;Available 24 hours/day (Patient reports son is at home) Type of Home: House Home Access: Stairs to enter Entrance Stairs-Rails: None Entrance Stairs-Number of Steps: 3 Home Layout: One level Home Equipment: None Additional Comments: Information per patient - needs to be verified.  Per patient, she helped with grandchildren - 64 yo and 61 yo.    Prior Function Level of Independence: Independent         Comments: Reports children help with meals and housekeeping     Hand Dominance   Dominant Hand: Right    Extremity/Trunk Assessment   Upper Extremity Assessment: Generalized weakness           Lower Extremity Assessment: Generalized weakness         Communication   Communication: No difficulties  Cognition Arousal/Alertness: Awake/alert Behavior During Therapy: Flat affect Overall Cognitive Status: No family/caregiver present to determine baseline cognitive functioning Area of Impairment: Orientation;Following commands;Problem solving Orientation Level: Disoriented to;Place;Time  Following Commands: Follows one step commands inconsistently;Follows one step commands with increased time     Problem Solving: Slow processing;Decreased initiation;Difficulty sequencing;Requires verbal cues      General Comments      Exercises        Assessment/Plan    PT Assessment Patient needs continued PT services  PT Diagnosis Difficulty walking;Generalized  weakness;Altered mental status   PT Problem List Decreased strength;Decreased activity tolerance;Decreased balance;Decreased mobility;Decreased cognition;Decreased knowledge of use of DME  PT Treatment Interventions DME instruction;Gait training;Stair training;Functional mobility training;Therapeutic activities;Therapeutic exercise;Balance training;Patient/family education   PT Goals (Current goals can be found in the Care Plan section) Acute Rehab PT Goals Patient Stated Goal: None stated PT Goal Formulation: With patient Time For Goal Achievement: 01/01/15 Potential to Achieve Goals: Good    Frequency Min 3X/week   Barriers to discharge        Co-evaluation               End of Session Equipment Utilized During Treatment: Gait belt Activity Tolerance: Patient limited by fatigue Patient left: in chair;with call bell/phone within reach Nurse Communication: Mobility status         Time: 5366-4403 PT Time Calculation (min) (ACUTE ONLY): 16 min   Charges:   PT Evaluation $Initial PT Evaluation Tier I: 1 Procedure     PT G CodesVena Austria 2015-01-14, 3:37 PM Durenda Hurt. Renaldo Fiddler, [facility] Acute Rehab Services Pager [phone]

## 2014-12-18 NOTE — Progress Notes (Signed)
Patient arrived on unit via wheelchair from 3S. Telemetry placed per MD order and CMT notified.

## 2014-12-18 NOTE — Progress Notes (Signed)
Valerie CohoSandra Sellers 10:18 AM  Subjective: Patient doing better and she believes she is having less drainage even though that a moderate amount was recorded she is tolerating clear liquids and passing gas and is happier overall  Objective: Vital signs stable afebrile no acute distress abdomen is decreased tender good bowel sounds soft bilirubin slight decrease white count okay  Assessment: Bile leak status post stenting  Plan: Please call us back if we could be of any further help this hospital stay otherwise I've discussed stent removal in 2 months with patient either here by me or [facility],Crosley Stejskal E  Pager [phone] After 5PM or if no answer call [phone]

## 2014-12-18 NOTE — Progress Notes (Signed)
Utilization Review Completed.Valerie Sellers T7/17/2016  

## 2014-12-18 NOTE — Progress Notes (Signed)
Patient Demographics:    Valerie Sellers, is a 59 y.o. female, DOB - 09/21/1955, WUJ:[id number]  Admit date - 12/16/2014   Admitting Physician Therisa DoyneAnastassia Doutova, MD  [facility]  LOS - 2   No chief complaint on file.       Subjective:    Valerie Sellers today has, No headache, No chest pain, mild generalized abdominal pain - No Nausea, No new weakness tingling or numbness, No Cough - SOB.     Assessment  & Plan :     1. Postcholecystectomy biliary leak and biliary peritonitis with [facility]. Initial surgery for cholecystectomy was on July 11, ERCP was on July 13, underwent biliary drain [facility] on July 14.  Continued to have generalized pain, was seen by GI physician Dr. Rexanne ManoMark Maygod on 12/17/2014 and underwent ERCP with sphincterotomy and bloody stent placement, she looks and feels better, continue IV anti-biotics for now along with pain control. Advance diet. Repeat CBC CMP in the morning. Increase activity.    2. Shortness of breath. Minimal shortness of breath today on exam, stable on 2 L nasal cannula, does have bilateral atelectasis, he has improved with  IS and trial of Lasix . We will place on nebulizer treatments as needed. Monitor closely. Does have history of asthma but with no wheezing at this time.   3. EKG with nonspecific changes and mildly elevated troponin.  Troponin trend and non-ACS pattern likely mildly high due to biliary peritonitis from #1 above, chest pain-free,Check echocardiogram to evaluate wall motion, placed on aspirin along with low-dose beta blocker. Denies any previous CAD or smoking history, nondiabetic.   4. Dyslipidemia. Statin resumed.      Code Status : Full,  prognosis guarded  Family Communication  : None present  Disposition Plan  : Move to a telemetry bed, commence PT.  Consults  :  GI Dr. Loreta AveMann and Maygod  Procedures  :   ERCP. Done by Dr. Ewing SchleinMagod on 12/17/2014, underwent spondylectomy with stent placement.  Echocardiogram ordered  DVT Prophylaxis  :   Heparin    Lab Results  Component Value Date   PLT 162 12/18/2014    Inpatient Medications  Scheduled Meds: . antiseptic oral rinse  7 mL Mouth Rinse q12n4p  . aspirin  150 mg Rectal Daily  . chlorhexidine  15 mL Mouth Rinse BID  . furosemide  20 mg Intravenous Once  . heparin subcutaneous  5,000 Units Subcutaneous 3 times per day  . pantoprazole (PROTONIX) IV  40 mg Intravenous QHS  . piperacillin-tazobactam (ZOSYN)  IV  3.375 g Intravenous 3 times per day  . sodium chloride  3 mL Intravenous Q12H   Continuous Infusions: . sodium chloride 50 mL/hr at 12/18/14 0800   PRN Meds:.acetaminophen **OR** acetaminophen, albuterol, hydrALAZINE, HYDROmorphone (DILAUDID) injection, metoprolol, ondansetron **OR** ondansetron (ZOFRAN) IV  Antibiotics  :    Anti-infectives    Start     Dose/Rate Route Frequency Ordered Stop   12/17/14 0600  piperacillin-tazobactam (ZOSYN) IVPB 3.375 g     3.375 g 12.5 mL/hr over 240 Minutes Intravenous 3 times per day 12/17/14 0035     12/17/14 0045  piperacillin-tazobactam (ZOSYN) IVPB 3.375 g     3.375 g 100 mL/hr over 30 Minutes Intravenous  Once 12/17/14 0041 12/17/14 0152        Objective:   Filed Vitals:   12/17/14 1953 12/17/14 2334 12/18/14 0444 12/18/14 0824  BP: 131/70 136/76 136/70 137/66  Pulse: 102 96 89 84  Temp: 98.1 F (36.7 C) 98.3 F (36.8 C) 98.2 F (36.8 C) 97.9 F (36.6 C)  TempSrc: Oral Oral Oral Oral  Resp: 20 19 17 15   Height:      Weight:      SpO2: 93% 96% 95% 95%    Wt Readings from Last 3 Encounters:  12/16/14 67.8 kg (149 lb 7.6 oz)     Intake/Output Summary (Last 24 hours) at 12/18/14 0939 Last data  filed at 12/18/14 0900  Gross per 24 hour  Intake 2241.25 ml  Output   1750 ml  Net 491.25 ml     Physical Exam  Awake Alert, Oriented X 3, No new F.N deficits, Normal affect [place].AT,PERRAL Supple Neck,No JVD, No cervical lymphadenopathy appriciated.  Symmetrical Chest wall movement, reduced breath sounds in both bases RRR,No Gallops,Rubs or new Murmurs, No Parasternal Heave Hypoactive but +ve B.Sounds, Abd Soft, No tenderness, No organomegaly appriciated, No rebound - guarding or rigidity. Right-sided cholecystectomy drain in the abdomen, NG tube in place No Cyanosis, Clubbing or edema, No new Rash or bruise      Data Review:   Micro Results Recent Results (from the past 240 hour(s))  MRSA PCR Screening     Status: None   Collection Time: 12/16/14 11:15 PM  Result Value Ref Range Status   MRSA by PCR NEGATIVE NEGATIVE Final    Comment:        The GeneXpert MRSA Assay (FDA approved for NASAL specimens only), is one component of a comprehensive MRSA colonization surveillance program. It is not intended to diagnose MRSA infection nor to guide or monitor treatment for MRSA infections.   Gram stain     Status: None   Collection Time: 12/17/14  1:25 AM  Result Value Ref Range Status   Specimen Description FLUID BILE  Final   Special Requests NONE  Final   Gram Stain WBCPM NO ORGANISMS SEEN  Final   Report Status 12/17/2014 FINAL  Final    Radiology Reports Dg Chest Port 1 View  12/17/2014   CLINICAL DATA:  History of asthma.  History of hypertension.  EXAM: PORTABLE CHEST - 1 VIEW  COMPARISON:  None.  FINDINGS: Nasogastric tube is in place with tip to level of the distal stomach. Film is made with low lung volumes. Heart size is probably normal. There is small left pleural effusion. Opacity at the left lung base obscures the medial left hemidiaphragm consistent atelectasis or infiltrate. Consider PA and lateral chest if the patient is able.  IMPRESSION: 1. Small left pleural  effusion. 2. Left lower lobe atelectasis or infiltrate.   Electronically Signed   By: Norva Pavlov M.D.   On: 12/17/2014 08:06   Dg Ercp Biliary & Pancreatic Ducts  12/17/2014   CLINICAL DATA:  Common bile duct stent placement  EXAM: ERCP  TECHNIQUE: Multiple spot images obtained with the fluoroscopic device and submitted for interpretation post-procedure.  FLUOROSCOPY TIME:  Radiation Exposure Index (as provided by the fluoroscopic device): Not available  If the device does not provide the exposure index:  Fluoroscopy Time:  12 minutes 39 seconds  Number of Acquired Images:  2  COMPARISON:  None.  FINDINGS: Injection was performed the common bile duct and reveals no definitive filling defect. Correlation with the operative findings is recommended.  IMPRESSION: No definitive common bile duct filling defect.  These images were submitted for radiologic interpretation only. Please see the procedural report for the amount of contrast and the fluoroscopy time utilized.   Electronically Signed   By: Alcide Clever M.D.   On: 12/17/2014 16:16   Dg Abd Portable 1v  12/17/2014   CLINICAL DATA:  Hypoxia.  EXAM: PORTABLE ABDOMEN - 1 VIEW  COMPARISON:  Chest x-ray same day  FINDINGS: Single supine image of the abdomen demonstrates residual contrast in the ascending colon. Bowel gas pattern is nonobstructive. Percutaneous catheter has a right lateral approach in the right upper quadrant. Visualized osseous structures have a normal appearance.  IMPRESSION: 1. Nonobstructive bowel gas pattern. 2. Percutaneous catheter right upper quadrant.   Electronically Signed   By: Norva Pavlov M.D.   On: 12/17/2014 08:07     CBC  Recent Labs Lab 12/17/14 0244 12/18/14 0230  WBC 13.0* 8.3  HGB 13.7 12.7  HCT 39.5 37.1  PLT 193 162  MCV 90.6 91.2  MCH 31.4 31.2  MCHC 34.7 34.2  RDW 13.8 13.7  LYMPHSABS 0.7  --   MONOABS 1.2*  --   EOSABS 0.0  --   BASOSABS 0.0  --     Chemistries   Recent Labs Lab  12/17/14 0244 12/18/14 0230  NA 139 137  K 3.1* 3.1*  CL 102 101  CO2 29 33*  GLUCOSE 117* 127*  BUN 12 13  CREATININE 0.65 0.57  CALCIUM 7.9* 7.3*  MG 2.0 1.9  AST 14* 19  ALT 10* 10*  ALKPHOS 60 62  BILITOT 3.7* 2.6*   ------------------------------------------------------------------------------------------------------------------ estimated creatinine clearance is 68.3 mL/min (by C-G formula based on Cr of 0.57). ------------------------------------------------------------------------------------------------------------------ No results for input(s): HGBA1C in the last 72 hours. ------------------------------------------------------------------------------------------------------------------ No results for input(s): CHOL, HDL, LDLCALC, TRIG, CHOLHDL, LDLDIRECT in the last 72 hours. ------------------------------------------------------------------------------------------------------------------  Recent Labs  12/17/14 0244  TSH 3.701   ------------------------------------------------------------------------------------------------------------------ No results for input(s): VITAMINB12, FOLATE, FERRITIN, TIBC, IRON, RETICCTPCT in the last 72 hours.  Coagulation profile  Recent Labs Lab 12/17/14 0244  INR 1.15    No results for input(s): DDIMER in the last 72 hours.  Cardiac Enzymes  Recent Labs Lab 12/17/14 0244 12/17/14 0747 12/17/14 1805  TROPONINI 0.08* 0.18* 0.03   ------------------------------------------------------------------------------------------------------------------ Invalid input(s): POCBNP   Time Spent in minutes  35   Dierre Crevier K M.D on 12/18/2014 at 9:39 AM  Between 7am to 7pm - Pager - [phone]  After 7pm go to [email or web address] - password [facility]  [facility]   Office  [phone]

## 2014-12-19 ENCOUNTER — Encounter (HOSPITAL_COMMUNITY): Payer: Self-pay | Admitting: Gastroenterology

## 2014-12-19 ENCOUNTER — Ambulatory Visit (HOSPITAL_COMMUNITY): Payer: Medicaid Other

## 2014-12-19 DIAGNOSIS — R932 Abnormal findings on diagnostic imaging of liver and biliary tract: Secondary | ICD-10-CM | POA: Insufficient documentation

## 2014-12-19 DIAGNOSIS — I509 Heart failure, unspecified: Secondary | ICD-10-CM

## 2014-12-19 DIAGNOSIS — R5381 Other malaise: Secondary | ICD-10-CM

## 2014-12-19 DIAGNOSIS — K838 Other specified diseases of biliary tract: Secondary | ICD-10-CM

## 2014-12-19 DIAGNOSIS — K929 Disease of digestive system, unspecified: Secondary | ICD-10-CM

## 2014-12-19 LAB — CBC
HCT: 34.5 % — ABNORMAL LOW (ref 36.0–46.0)
Hemoglobin: 11.7 g/dL — ABNORMAL LOW (ref 12.0–15.0)
MCH: 30.7 pg (ref 26.0–34.0)
MCHC: 33.9 g/dL (ref 30.0–36.0)
MCV: 90.6 fL (ref 78.0–100.0)
Platelets: 152 10*3/uL (ref 150–400)
RBC: 3.81 MIL/uL — ABNORMAL LOW (ref 3.87–5.11)
RDW: 13.5 % (ref 11.5–15.5)
WBC: 6.4 10*3/uL (ref 4.0–10.5)

## 2014-12-19 LAB — COMPREHENSIVE METABOLIC PANEL
ALK PHOS: 80 U/L (ref 38–126)
ALT: 9 U/L — ABNORMAL LOW (ref 14–54)
ANION GAP: 6 (ref 5–15)
AST: 21 U/L (ref 15–41)
Albumin: 1.6 g/dL — ABNORMAL LOW (ref 3.5–5.0)
BILIRUBIN TOTAL: 2.2 mg/dL — AB (ref 0.3–1.2)
BUN: 7 mg/dL (ref 6–20)
CHLORIDE: 102 mmol/L (ref 101–111)
CO2: 32 mmol/L (ref 22–32)
Calcium: 7.5 mg/dL — ABNORMAL LOW (ref 8.9–10.3)
Creatinine, Ser: 0.52 mg/dL (ref 0.44–1.00)
GFR calc non Af Amer: >60 mL/min (ref >60)
Glucose, Bld: 109 mg/dL — ABNORMAL HIGH (ref 65–99)
Potassium: 3 mmol/L — ABNORMAL LOW (ref 3.5–5.1)
SODIUM: 140 mmol/L (ref 135–145)
Total Protein: 4.8 g/dL — ABNORMAL LOW (ref 6.5–8.1)

## 2014-12-19 LAB — GLUCOSE, CAPILLARY: Glucose-Capillary: 119 mg/dL — ABNORMAL HIGH (ref 65–99)

## 2014-12-19 MED ORDER — PANTOPRAZOLE SODIUM 40 MG PO TBEC
40.0000 mg | DELAYED_RELEASE_TABLET | Freq: Every day | ORAL | Status: DC
  Administered 2014-12-19 – 2014-12-20 (×2): 40 mg via ORAL
  Filled 2014-12-19 (×2): qty 1

## 2014-12-19 MED ORDER — POTASSIUM CHLORIDE CRYS ER 20 MEQ PO TBCR
40.0000 meq | EXTENDED_RELEASE_TABLET | ORAL | Status: AC
  Administered 2014-12-19 (×2): 40 meq via ORAL
  Filled 2014-12-19 (×2): qty 2

## 2014-12-19 MED ORDER — MAGNESIUM SULFATE IN D5W 10-5 MG/ML-% IV SOLN
1.0000 g | Freq: Once | INTRAVENOUS | Status: AC
  Administered 2014-12-19: 1 g via INTRAVENOUS
  Filled 2014-12-19: qty 100

## 2014-12-19 MED ORDER — OXYCODONE HCL 5 MG PO TABS: 5.0000 mg | ORAL_TABLET | ORAL | Status: DC | PRN

## 2014-12-19 NOTE — Progress Notes (Signed)
Rehab admissions - Evaluated for possible admission.  Currently rehab beds are full.  I will follow up again in am for bed availability.  Call me for questions.  #[phone]

## 2014-12-19 NOTE — Progress Notes (Signed)
Physical Therapy Treatment Patient Details Name: Valerie Sellers MRN: [id number] DOB: March 15, 1956 Today's Date: 12/19/2014    History of Present Illness Patient is a 59 yo female admitted 12/16/14 with abdominal pain - bile leak post-op.  Patient s/p cholecystectomy 12/12/14.  Patient now s/p ERCP with sphincterotomy/papillotomy and ERCP with stent placement.  PMH:  Hypertension; Hypercholesteremia; Asthma; and Diverticulosis    PT Comments    Noting improvements in activity tolerance, and gait distance over initial eval yesterday; Continue to recommend comprehensive inpatient rehab (CIR) for post-acute therapy needs to maximize independence and safety with mobility prior to dc home  Follow Up Recommendations  CIR;Supervision/Assistance - 24 hour     Equipment Recommendations  Rolling walker with 5" wheels    Recommendations for Other Services OT consult     Precautions / Restrictions Precautions Precautions: Fall Restrictions Weight Bearing Restrictions: No    Mobility  Bed Mobility               General bed mobility comments: Sitting EOB upon arrival  Transfers Overall transfer level: Needs assistance Equipment used: Rolling walker (2 wheeled) Transfers: Sit to/from Stand Sit to Stand: Min assist         General transfer comment: Cues for safe hand placement and to scoot to edge of bed.  Slow to respond to cueing.  Required assist to power up to standing and for balance.  Ambulation/Gait Ambulation/Gait assistance: Mod assist Ambulation Distance (Feet): 50 Feet Assistive device: Rolling walker (2 wheeled) (second person to push chair behind) Gait Pattern/deviations: Decreased step length - right;Decreased step length - left;Decreased stride length;Decreased stance time - right;Decreased stance time - left;Trunk flexed Gait velocity: very slow   General Gait Details: Short, halting steps in RW; cues for more stable body position relative to RW (pt stepping toofar in  front of wheels, putting her at risk of posterior loss of balance); Mod assist mostly for RW advancement and management   Stairs            Wheelchair Mobility    Modified Rankin (Stroke Patients Only)       Balance Overall balance assessment: Needs assistance         Standing balance support: Bilateral upper extremity supported Standing balance-Leahy Scale: Poor                      Cognition Arousal/Alertness: Awake/alert Behavior During Therapy: Flat affect Overall Cognitive Status: Within Functional Limits for tasks assessed (Slow to respond to cues, but does respond to all cues given)                      Exercises      General Comments        Pertinent Vitals/Pain Pain Assessment: No/denies pain    Home Living                      Prior Function            PT Goals (current goals can now be found in the care plan section) Acute Rehab PT Goals Patient Stated Goal: Agreeable to walk PT Goal Formulation: With patient Time For Goal Achievement: 01/01/15 Potential to Achieve Goals: Good Progress towards PT goals: Progressing toward goals    Frequency  Min 3X/week    PT Plan Current plan remains appropriate    Co-evaluation             End of Session Equipment  Utilized During Treatment: Gait belt (at axillae) Activity Tolerance: Patient tolerated treatment well Patient left: in chair;with call bell/phone within reach;with chair alarm set     Time: [phone] (minus approx 5 minutes while GI doc came in) PT Time Calculation (min) (ACUTE ONLY): 25 min  Charges:  $Gait Training: 8-22 mins                    G Codes:      Olen PelGarrigan, Ericia Moxley Hamff 12/19/2014, 10:36 AM  Van ClinesHolly Isadore Palecek, PT  Acute Rehabilitation Services Pager [phone] Office [phone]

## 2014-12-19 NOTE — Progress Notes (Signed)
Received prescreen request for inpatient rehab and noted that rehab consult has already been ordered. Genie, rehab admission coordinator, will follow up after rehab consult is completed. She can be reached at [phone]. Thanks.  Juliann MuleJanine Vonte Rossin, PT Rehabilitation Admissions Coordinator [phone]

## 2014-12-19 NOTE — Consult Note (Signed)
Physical Medicine and Rehabilitation Consult  Reason for Consult: Deconditioning Referring Physician: Dr. Thedore MinsSingh   HPI: Valerie Sellers is a 59 y.o. female with history of HTN, asthma, recent Lap Chole 12/12/14 who was [facility]14 with bile leak and HIDA with ERCP attempted without success. Post procedure, patient developed tachycardia and significant abdominal pain with bloating. She underwent percutaneous drain placement by IR but continued to have high output therefore patient was [facility] for treatment. She underwent ERCP with sphincterotomy by Dr. Ewing SchleinMagod on 07/16 with recommendations for follow up in 2 months for stent remove al. PT evaluation done yesterday and CIR recommended for follow up therapy. Patient reports that she sat up for 2 hours yesterday   Review of Systems  HENT: Negative for hearing loss.   Respiratory: Positive for shortness of breath (with activity). Negative for cough.   Cardiovascular: Negative for chest pain and palpitations.  Gastrointestinal: Negative for heartburn and nausea.  Genitourinary: Negative for dysuria and urgency.  Musculoskeletal: Positive for myalgias and back pain.  Neurological: Negative for dizziness, tingling, seizures and headaches.  Psychiatric/Behavioral: Negative for memory loss. The patient is nervous/anxious.      Past Medical History  Diagnosis Date  . Hypertension   . Hypercholesteremia   . Asthma   . Diverticulosis     Past Surgical History  Procedure Laterality Date  . Cholecystectomy  12/12/2014    Family History  Problem Relation Age of Onset  . Stomach cancer Mother   . Asthma Father   . Cancer Father   . Lung cancer Brother     Social History:  Lives with family. 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    Multiple family members at home who can assist past discharge. alone. Has family who can check in PTA. reports that she has never  smoked. She does not have any smokeless tobacco history on file. She reports that she does not drink alcohol or use illicit drugs.    Allergies: No Known Allergies    Medications Prior to Admission  Medication Sig Dispense Refill  . albuterol (PROVENTIL HFA;VENTOLIN HFA) 108 (90 BASE) MCG/ACT inhaler Inhale 1 puff into the lungs every 6 (six) hours as needed for wheezing or shortness of breath.    [name]. alendronate (FOSAMAX) 70 MG tablet Take 70 mg by mouth once a week.  3  . atorvastatin (LIPITOR) 20 MG tablet Take 20 mg by mouth daily.    . Cholecalciferol (VITAMIN D3) 5000 UNITS CAPS Take 1 capsule by mouth daily.    [name]. lisinopril (PRINIVIL,ZESTRIL) 20 MG tablet Take 20 mg by mouth daily.    [name]. loratadine (CLARITIN) 10 MG tablet Take 10 mg by mouth daily.    . montelukast (SINGULAIR) 10 MG tablet Take 10 mg by mouth daily.     [name]. omeprazole (PRILOSEC) 20 MG capsule Take 20 mg by mouth daily.    . ondansetron (ZOFRAN) 4 MG tablet Take 4 mg by mouth every 6 (six) hours as needed for nausea or vomiting.   0  . [DISCONTINUED] oxyCODONE (OXY IR/ROXICODONE) 5 MG immediate release tablet Take 5 mg by mouth every 4 (four) hours as needed for moderate pain.   0    Home: Home Living Family/patient expects to be discharged to:: Private residence Living Arrangements: Alone, Children (Patient reports daughter, son/girlfriend and 2 grandchildren) Available Help at Discharge: Family, Available 24 hours/day (Patient reports son  is at home) Type of Home: House Home Access: Stairs to enter [employer] of Steps: 3 Entrance Stairs-Rails: None Home Layout: One level Home Equipment: None Additional Comments: Information per patient - needs to be verified.  Per patient, she helped with grandchildren - 20 yo and 29 yo.  Functional History: Prior Function Level of Independence: Independent Comments: Reports children help with meals and housekeeping Functional Status:  Mobility:    Transfers Overall transfer level: Needs assistance Equipment used: 1 person hand held assist Transfers: Sit to/from Stand Sit to Stand: Min assist General transfer comment: Cues for safe hand placement and to scoot to edge of chair.  Slow to respond to cueing or to ask for assist when unable to stand initially.  Required assist to power up to standing and for balance. Ambulation/Gait Ambulation/Gait assistance: Mod assist Ambulation Distance (Feet): 1 Feet Assistive device: 1 person hand held assist Gait Pattern/deviations: Shuffle, Festinating General Gait Details: Verbal cues to step forward.  Patient unsteady in stance and with attempts to take steps.  LE's shaking.  Difficulty stepping with RLE.  Knees buckling.  Returned to sitting.  Will need +2 assist at next session.    ADL:    Cognition: Cognition Overall Cognitive Status: No family/caregiver present to determine baseline cognitive functioning Orientation Level: Oriented X4 Cognition Arousal/Alertness: Awake/alert Behavior During Therapy: Flat affect Overall Cognitive Status: No family/caregiver present to determine baseline cognitive functioning Area of Impairment: Orientation, Following commands, Problem solving Orientation Level: Disoriented to, Place, Time Following Commands: Follows one step commands inconsistently, Follows one step commands with increased time Problem Solving: Slow processing, Decreased initiation, Difficulty sequencing, Requires verbal cues  Blood pressure 132/71, pulse 88, temperature 98 F (36.7 C), temperature source Oral, resp. rate 18, height 5\' 3"  (1.6 m), weight 67.2 kg (148 lb 2.4 oz), SpO2 93 %. Physical Exam  Nursing note and vitals reviewed. Constitutional: She is oriented to person, place, and time. She appears well-developed and well-nourished.  HENT:  Head: Normocephalic and atraumatic.  Eyes: Conjunctivae are normal. Pupils are equal, round, and reactive to light.  Neck: Normal  range of motion. Neck supple.  Cardiovascular: Normal rate and regular rhythm.   Respiratory: Effort normal and breath sounds normal. No respiratory distress. She has no wheezes.  GI: Soft. Bowel sounds are normal. She exhibits no distension. There is no tenderness.  Drain with clear yellow fluid.   Musculoskeletal: She exhibits no tenderness.  Neurological: She is alert and oriented to person, place, and time.  Flat affect. Dysarthric speech. UE's 4/5 prox to distal. LE: 3/5 hf, 4-/5 ke and 4/5 ankles. No gross sensory deficits. Exam limited by abdominal pain. Has fair awareness but insight appears very basic.   Skin: Skin is warm and dry.  Psychiatric:  Flat and disengaged    Results for orders placed or performed during the hospital encounter of 12/16/14 (from the past 24 hour(s))  Glucose, capillary     Status: Abnormal   Collection Time: 12/19/14  4:15 AM  Result Value Ref Range   Glucose-Capillary 119 (H) 65 - 99 mg/dL  CBC     Status: Abnormal   Collection Time: 12/19/14  5:24 AM  Result Value Ref Range   WBC 6.4 4.0 - 10.5 K/uL   RBC 3.81 (L) 3.87 - 5.11 MIL/uL   Hemoglobin 11.7 (L) 12.0 - 15.0 g/dL   HCT 16.1 (L) 09.6 - 04.5 %   MCV 90.6 78.0 - 100.0 fL   MCH 30.7 26.0 - 34.0  pg   MCHC 33.9 30.0 - 36.0 g/dL   RDW 16.1 09.6 - 04.5 %   Platelets 152 150 - 400 K/uL  Comprehensive metabolic panel     Status: Abnormal   Collection Time: 12/19/14  5:24 AM  Result Value Ref Range   Sodium 140 135 - 145 mmol/L   Potassium 3.0 (L) 3.5 - 5.1 mmol/L   Chloride 102 101 - 111 mmol/L   CO2 32 22 - 32 mmol/L   Glucose, Bld 109 (H) 65 - 99 mg/dL   BUN 7 6 - 20 mg/dL   Creatinine, Ser 4.09 0.44 - 1.00 mg/dL   Calcium 7.5 (L) 8.9 - 10.3 mg/dL   Total Protein 4.8 (L) 6.5 - 8.1 g/dL   Albumin 1.6 (L) 3.5 - 5.0 g/dL   AST 21 15 - 41 U/L   ALT 9 (L) 14 - 54 U/L   Alkaline Phosphatase 80 38 - 126 U/L   Total Bilirubin 2.2 (H) 0.3 - 1.2 mg/dL   GFR calc non Af Amer >60 >60 mL/min    GFR calc Af Amer >60 >60 mL/min   Anion gap 6 5 - 15   Dg Ercp Biliary & Pancreatic Ducts  12/17/2014   CLINICAL DATA:  Common bile duct stent placement  EXAM: ERCP  TECHNIQUE: Multiple spot images obtained with the fluoroscopic device and submitted for interpretation post-procedure.  FLUOROSCOPY TIME:  Radiation Exposure Index (as provided by the fluoroscopic device): Not available  If the device does not provide the exposure index:  Fluoroscopy Time:  12 minutes 39 seconds  Number of Acquired Images:  2  COMPARISON:  None.  FINDINGS: Injection was performed the common bile duct and reveals no definitive filling defect. Correlation with the operative findings is recommended.  IMPRESSION: No definitive common bile duct filling defect.  These images were submitted for radiologic interpretation only. Please see the procedural report for the amount of contrast and the fluoroscopy time utilized.   Electronically Signed   By: Alcide Clever M.D.   On: 12/17/2014 16:16    Assessment/Plan: Diagnosis: debility to complications after lap chole. 1. Does the need for close, 24 hr/day medical supervision in concert with the patient's rehab needs make it unreasonable for this patient to be served in a less intensive setting? Yes 2. Co-Morbidities requiring supervision/potential complications: htn, pain, wound care, bp control 3. Due to bladder management, bowel management, safety, skin/wound care, disease management, medication administration, pain management and patient education, does the patient require 24 hr/day rehab nursing? Yes 4. Does the patient require coordinated care of a physician, rehab nurse, PT (1-2 hrs/day, 5 days/week) and OT (1-2 hrs/day, 5 days/week) to address physical and functional deficits in the context of the above medical diagnosis(es)? Yes Addressing deficits in the following areas: balance, endurance, locomotion, strength, transferring, bowel/bladder control, bathing, dressing, feeding,  grooming, toileting and psychosocial support 5. Can the patient actively participate in an intensive therapy program of at least 3 hrs of therapy per day at least 5 days per week? Yes 6. The potential for patient to make measurable gains while on inpatient rehab is excellent 7. Anticipated functional outcomes upon discharge from inpatient rehab are modified independent  with PT, modified independent with OT, n/a with SLP. 8. Estimated rehab length of stay to reach the above functional goals is: 7 days 9. Does the patient have adequate social supports and living environment to accommodate these discharge functional goals? Yes 10. Anticipated D/C setting: Home 11. Anticipated post  D/C treatments: HH therapy 12. Overall Rehab/Functional Prognosis: excellent  RECOMMENDATIONS: This patient's condition is appropriate for continued rehabilitative care in the following setting: CIR Patient has agreed to participate in recommended program. Yes Note that insurance prior authorization may be required for reimbursement for recommended care.  Comment: Rehab Admissions Coordinator to follow up.  Thanks,  Ranelle Oyster, MD, [place] [facility]     12/19/2014

## 2014-12-19 NOTE — Progress Notes (Signed)
*  PRELIMINARY RESULTS* Echocardiogram 2D Echocardiogram has been performed.  [name], [name] 12/19/2014, 12:44 PM

## 2014-12-19 NOTE — Progress Notes (Signed)
Patient Demographics:    Valerie Sellers, is a 59 y.o. female, DOB - [birth year], ZOX:[id number]  Admit date - 12/16/2014   Admitting Physician Therisa Doyne, MD  Outpatient Primary MD for the patient is [facility], MD  LOS - 3   No chief complaint on file.       Subjective:    Valerie Sellers today has, No headache, No chest pain, mild generalized abdominal pain - No Nausea, No new weakness tingling or numbness, No Cough - SOB.     Assessment  & Plan :     1. Postcholecystectomy biliary leak and biliary peritonitis with a unsuccessful ERCP done at [facility]. Initial surgery for cholecystectomy was on July 11, ERCP was on July 13, underwent biliary drain placement by IR at [facility] on July 14.  Continued to have generalized pain, was seen by GI physician Dr. Rexanne Mano on 12/17/2014 and underwent ERCP with sphincterotomy and bloody stent placement, she looks and feels better, continue IV anti-biotics for now along with pain control. Tolerating diet, labs improved. Still has drainage from her gallbladder drain. Overall symptom free likely discharge to SNF tomorrow.   2. Shortness of breath. Minimal shortness of breath today on exam, stable on 2 L nasal cannula, does have bilateral atelectasis, he has improved with  IS and trial of Lasix . We will place on nebulizer treatments as needed. Monitor closely. Does have history of asthma but with no wheezing at this time. Pending echocardiogram.   3. EKG with nonspecific changes and mildly elevated troponin.  Troponin trend and non-ACS pattern likely mildly high due to biliary peritonitis from #1 above, chest pain-free, Pending echocardiogram to evaluate wall motion, placed on aspirin along with low-dose beta blocker. Denies any previous CAD or  smoking history, nondiabetic.   4. Dyslipidemia. Statin resumed.    5. Hypokalemia. Replaced again, recheck in the morning.    Code Status : Full, prognosis guarded  Family Communication  : None present  Disposition Plan  : Move to a telemetry bed, commence PT.  Consults  :  GI Dr. Loreta Ave and [place]  Procedures  :   ERCP. Done by Dr. Ewing Schlein on 12/17/2014, underwent spondylectomy with stent placement.  Echocardiogram ordered  DVT Prophylaxis  :   Heparin    Lab Results  Component Value Date   PLT 152 12/19/2014    Inpatient Medications  Scheduled Meds: . antiseptic oral rinse  7 mL Mouth Rinse q12n4p  . aspirin  81 mg Oral Daily  . atorvastatin  20 mg Oral q1800  . carvedilol  3.125 mg Oral BID WC  . chlorhexidine  15 mL Mouth Rinse BID  . heparin subcutaneous  5,000 Units Subcutaneous 3 times per day  . magnesium sulfate 1 - 4 g bolus IVPB  1 g Intravenous Once  . pantoprazole (PROTONIX) IV  40 mg Intravenous QHS  . piperacillin-tazobactam (ZOSYN)  IV  3.375 g Intravenous 3 times per day  . potassium chloride  40 mEq Oral Q4H  . sodium chloride  3 mL Intravenous Q12H   Continuous Infusions:   PRN Meds:.acetaminophen **OR** [DISCONTINUED] acetaminophen, albuterol, hydrALAZINE, HYDROmorphone (DILAUDID) injection, metoprolol, [DISCONTINUED] ondansetron **OR** ondansetron (ZOFRAN) IV  Antibiotics  :    Anti-infectives  Start     Dose/Rate Route Frequency Ordered Stop   12/17/14 0600  piperacillin-tazobactam (ZOSYN) IVPB 3.375 g     3.375 g 12.5 mL/hr over 240 Minutes Intravenous 3 times per day 12/17/14 0035     12/17/14 0045  piperacillin-tazobactam (ZOSYN) IVPB 3.375 g     3.375 g 100 mL/hr over 30 Minutes Intravenous  Once 12/17/14 0041 12/17/14 0152        Objective:   Filed Vitals:   12/18/14 1518 12/18/14 1711 12/18/14 2016 12/19/14 0420  BP: 129/65 131/70 147/79 132/71  Pulse: 92 88 93 88  Temp: 98 F (36.7 C) 98.2 F (36.8 C) 98.1 F (36.7  C) 98 F (36.7 C)  TempSrc: Oral Oral Oral Oral  Resp: 16 16 16 18   Height:      Weight:    67.2 kg (148 lb 2.4 oz)  SpO2: 91% 93% 92% 93%    Wt Readings from Last 3 Encounters:  12/19/14 67.2 kg (148 lb 2.4 oz)     Intake/Output Summary (Last 24 hours) at 12/19/14 0949 Last data filed at 12/19/14 0600  Gross per 24 hour  Intake   1690 ml  Output   1081 ml  Net    609 ml     Physical Exam  Awake Alert, Oriented X 3, No new F.N deficits, Normal affect [place].AT,PERRAL Supple Neck,No JVD, No cervical lymphadenopathy appriciated.  Symmetrical Chest wall movement, reduced breath sounds in both bases RRR,No Gallops,Rubs or new Murmurs, No Parasternal Heave Hypoactive but +ve B.Sounds, Abd Soft, No tenderness, No organomegaly appriciated, No rebound - guarding or rigidity. Right-sided cholecystectomy drain in the abdomen, NG tube in place No Cyanosis, Clubbing or edema, No new Rash or bruise      Data Review:   Micro Results Recent Results (from the past 240 hour(s))  MRSA PCR Screening     Status: None   Collection Time: 12/16/14 11:15 PM  Result Value Ref Range Status   MRSA by PCR NEGATIVE NEGATIVE Final    Comment:        The GeneXpert MRSA Assay (FDA approved for NASAL specimens only), is one component of a comprehensive MRSA colonization surveillance program. It is not intended to diagnose MRSA infection nor to guide or monitor treatment for MRSA infections.   Culture, body fluid-bottle     Status: None (Preliminary result)   Collection Time: 12/17/14  1:25 AM  Result Value Ref Range Status   Specimen Description FLUID BILE  Final   Special Requests NONE  Final   Culture NO GROWTH 1 DAY  Final   Report Status PENDING  Incomplete  Gram stain     Status: None   Collection Time: 12/17/14  1:25 AM  Result Value Ref Range Status   Specimen Description FLUID BILE  Final   Special Requests NONE  Final   Gram Stain WBCPM NO ORGANISMS SEEN  Final   Report Status  12/17/2014 FINAL  Final  Urine culture     Status: None   Collection Time: 12/17/14  4:45 AM  Result Value Ref Range Status   Specimen Description URINE, CLEAN CATCH  Final   Special Requests NONE  Final   Culture NO GROWTH 1 DAY  Final   Report Status 12/18/2014 FINAL  Final    Radiology Reports Dg Chest Port 1 View  12/17/2014   CLINICAL DATA:  History of asthma.  History of hypertension.  EXAM: PORTABLE CHEST - 1 VIEW  COMPARISON:  None.  FINDINGS: Nasogastric tube is in place with tip to level of the distal stomach. Film is made with low lung volumes. Heart size is probably normal. There is small left pleural effusion. Opacity at the left lung base obscures the medial left hemidiaphragm consistent atelectasis or infiltrate. Consider PA and lateral chest if the patient is able.  IMPRESSION: 1. Small left pleural effusion. 2. Left lower lobe atelectasis or infiltrate.   Electronically Signed   By: Norva Pavlov M.D.   On: 12/17/2014 08:06   Dg Ercp Biliary & Pancreatic Ducts  12/17/2014   CLINICAL DATA:  Common bile duct stent placement  EXAM: ERCP  TECHNIQUE: Multiple spot images obtained with the fluoroscopic device and submitted for interpretation post-procedure.  FLUOROSCOPY TIME:  Radiation Exposure Index (as provided by the fluoroscopic device): Not available  If the device does not provide the exposure index:  Fluoroscopy Time:  12 minutes 39 seconds  Number of Acquired Images:  2  COMPARISON:  None.  FINDINGS: Injection was performed the common bile duct and reveals no definitive filling defect. Correlation with the operative findings is recommended.  IMPRESSION: No definitive common bile duct filling defect.  These images were submitted for radiologic interpretation only. Please see the procedural report for the amount of contrast and the fluoroscopy time utilized.   Electronically Signed   By: Alcide Clever M.D.   On: 12/17/2014 16:16   Dg Abd Portable 1v  12/17/2014   CLINICAL DATA:   Hypoxia.  EXAM: PORTABLE ABDOMEN - 1 VIEW  COMPARISON:  Chest x-ray same day  FINDINGS: Single supine image of the abdomen demonstrates residual contrast in the ascending colon. Bowel gas pattern is nonobstructive. Percutaneous catheter has a right lateral approach in the right upper quadrant. Visualized osseous structures have a normal appearance.  IMPRESSION: 1. Nonobstructive bowel gas pattern. 2. Percutaneous catheter right upper quadrant.   Electronically Signed   By: Norva Pavlov M.D.   On: 12/17/2014 08:07     CBC  Recent Labs Lab 12/17/14 0244 12/18/14 0230 12/19/14 0524  WBC 13.0* 8.3 6.4  HGB 13.7 12.7 11.7*  HCT 39.5 37.1 34.5*  PLT 193 162 152  MCV 90.6 91.2 90.6  MCH 31.4 31.2 30.7  MCHC 34.7 34.2 33.9  RDW 13.8 13.7 13.5  LYMPHSABS 0.7  --   --   MONOABS 1.2*  --   --   EOSABS 0.0  --   --   BASOSABS 0.0  --   --     Chemistries   Recent Labs Lab 12/17/14 0244 12/18/14 0230 12/19/14 0524  NA 139 137 140  K 3.1* 3.1* 3.0*  CL 102 101 102  CO2 29 33* 32  GLUCOSE 117* 127* 109*  BUN 12 13 7   CREATININE 0.65 0.57 0.52  CALCIUM 7.9* 7.3* 7.5*  MG 2.0 1.9  --   AST 14* 19 21  ALT 10* 10* 9*  ALKPHOS 60 62 80  BILITOT 3.7* 2.6* 2.2*   ------------------------------------------------------------------------------------------------------------------ estimated creatinine clearance is 68 mL/min (by C-G formula based on Cr of 0.52). ------------------------------------------------------------------------------------------------------------------ No results for input(s): HGBA1C in the last 72 hours. ------------------------------------------------------------------------------------------------------------------ No results for input(s): CHOL, HDL, LDLCALC, TRIG, CHOLHDL, LDLDIRECT in the last 72 hours. ------------------------------------------------------------------------------------------------------------------  Recent Labs  12/17/14 0244  TSH 3.701    ------------------------------------------------------------------------------------------------------------------ No results for input(s): VITAMINB12, FOLATE, FERRITIN, TIBC, IRON, RETICCTPCT in the last 72 hours.  Coagulation profile  Recent Labs Lab 12/17/14 0244  INR 1.15    No results  for input(s): DDIMER in the last 72 hours.  Cardiac Enzymes  Recent Labs Lab 12/17/14 0244 12/17/14 0747 12/17/14 1805  TROPONINI 0.08* 0.18* 0.03   ------------------------------------------------------------------------------------------------------------------ Invalid input(s): POCBNP   Time Spent in minutes  35   SINGH,PRASHANT K M.D on 12/19/2014 at 9:49 AM  Between 7am to 7pm - Pager - [phone]  After 7pm go to [email or web address] - password [email or web address]  [facility]   Office  [phone]

## 2014-12-19 NOTE — Progress Notes (Signed)
Subjective: Less abdominal pain. Tolerating soft diet.  Objective: Vital signs in last 24 hours: Temp:  [98 F (36.7 C)-98.7 F (37.1 C)] 98 F (36.7 C) (07/18 0420) Pulse Rate:  [87-93] 88 (07/18 0420) Resp:  [13-18] 18 (07/18 0420) BP: (127-147)/(65-79) 132/71 mmHg (07/18 0420) SpO2:  [91 %-94 %] 93 % (07/18 0420) Weight:  [67.2 kg (148 lb 2.4 oz)] 67.2 kg (148 lb 2.4 oz) (07/18 0420) Weight change:  Last BM Date: 12/18/14  PE: GEN:  Deconditioned-appearing but is in NAD ABD:  Soft, mild distended, hypoactive bowel sounds, RUQ drain 200 cc bile  Lab Results: CBC    Component Value Date/Time   WBC 6.4 12/19/2014 0524   RBC 3.81* 12/19/2014 0524   HGB 11.7* 12/19/2014 0524   HCT 34.5* 12/19/2014 0524   PLT 152 12/19/2014 0524   MCV 90.6 12/19/2014 0524   MCH 30.7 12/19/2014 0524   MCHC 33.9 12/19/2014 0524   RDW 13.5 12/19/2014 0524   LYMPHSABS 0.7 12/17/2014 0244   MONOABS 1.2* 12/17/2014 0244   EOSABS 0.0 12/17/2014 0244   BASOSABS 0.0 12/17/2014 0244   CMP     Component Value Date/Time   NA 140 12/19/2014 0524   K 3.0* 12/19/2014 0524   CL 102 12/19/2014 0524   CO2 32 12/19/2014 0524   GLUCOSE 109* 12/19/2014 0524   BUN 7 12/19/2014 0524   CREATININE 0.52 12/19/2014 0524   CALCIUM 7.5* 12/19/2014 0524   PROT 4.8* 12/19/2014 0524   ALBUMIN 1.6* 12/19/2014 0524   AST 21 12/19/2014 0524   ALT 9* 12/19/2014 0524   ALKPHOS 80 12/19/2014 0524   BILITOT 2.2* 12/19/2014 0524   GFRNONAA >60 12/19/2014 0524   GFRAA >60 12/19/2014 0524   Assessment:  1.  Bile leak s/p ERCP with sphincterotomy and CBD stent 12/17/14. 2.  Biliary drainage from RUQ drain; discussed with nurse; unclear whether volume of drainage is less or more since ERCP.  Plan:  1.  Continue diet as tolerated. 2.  Discussed with nurse; asked to make clear notations so we can gauge over the next 48 hours what the volume trend (up vs down) is of RUQ biliary drain. 3.  Will  follow.   Valerie JakschOUTLAW,Kalijah Westfall M 12/19/2014, 9:23 AM   Pager [phone] If no answer or after 5 PM call [phone]

## 2014-12-20 ENCOUNTER — Inpatient Hospital Stay (HOSPITAL_COMMUNITY)
Admission: RE | Admit: 2014-12-20 | Discharge: 2014-12-29 | DRG: 372 | Disposition: A | Discharge status: Home / Self Care | Payer: Medicaid Other | Source: Intra-hospital | Attending: Physical Medicine & Rehabilitation | Admitting: Physical Medicine & Rehabilitation

## 2014-12-20 DIAGNOSIS — R000 Tachycardia, unspecified: Secondary | ICD-10-CM | POA: Diagnosis present

## 2014-12-20 DIAGNOSIS — E876 Hypokalemia: Secondary | ICD-10-CM | POA: Diagnosis present

## 2014-12-20 DIAGNOSIS — D62 Acute posthemorrhagic anemia: Secondary | ICD-10-CM | POA: Diagnosis present

## 2014-12-20 DIAGNOSIS — I10 Essential (primary) hypertension: Secondary | ICD-10-CM | POA: Diagnosis present

## 2014-12-20 DIAGNOSIS — K929 Disease of digestive system, unspecified: Secondary | ICD-10-CM | POA: Diagnosis not present

## 2014-12-20 DIAGNOSIS — R5381 Other malaise: Secondary | ICD-10-CM | POA: Diagnosis present

## 2014-12-20 DIAGNOSIS — K659 Peritonitis, unspecified: Principal | ICD-10-CM | POA: Diagnosis present

## 2014-12-20 DIAGNOSIS — K5900 Constipation, unspecified: Secondary | ICD-10-CM | POA: Diagnosis not present

## 2014-12-20 DIAGNOSIS — R0602 Shortness of breath: Secondary | ICD-10-CM | POA: Diagnosis not present

## 2014-12-20 DIAGNOSIS — R197 Diarrhea, unspecified: Secondary | ICD-10-CM | POA: Diagnosis present

## 2014-12-20 DIAGNOSIS — K9189 Other postprocedural complications and disorders of digestive system: Secondary | ICD-10-CM

## 2014-12-20 DIAGNOSIS — K838 Other specified diseases of biliary tract: Secondary | ICD-10-CM | POA: Diagnosis present

## 2014-12-20 LAB — POTASSIUM: Potassium: 3.4 mmol/L — ABNORMAL LOW (ref 3.5–5.1)

## 2014-12-20 MED ORDER — ONDANSETRON HCL 4 MG/2ML IJ SOLN: 4.0000 mg | Freq: Four times a day (QID) | INTRAMUSCULAR | Status: DC | PRN

## 2014-12-20 MED ORDER — CLONIDINE HCL 0.1 MG PO TABS: 0.1000 mg | ORAL_TABLET | Freq: Four times a day (QID) | ORAL | Status: DC | PRN

## 2014-12-20 MED ORDER — POTASSIUM CHLORIDE CRYS ER 20 MEQ PO TBCR
40.0000 meq | EXTENDED_RELEASE_TABLET | Freq: Once | ORAL | Status: AC
  Administered 2014-12-20: 40 meq via ORAL
  Filled 2014-12-20: qty 2

## 2014-12-20 MED ORDER — ONDANSETRON HCL 4 MG PO TABS
4.0000 mg | ORAL_TABLET | Freq: Four times a day (QID) | ORAL | Status: DC | PRN
  Administered 2014-12-27 – 2014-12-29 (×2): 4 mg via ORAL
  Filled 2014-12-20 (×2): qty 1

## 2014-12-20 MED ORDER — CARVEDILOL 3.125 MG PO TABS
3.1250 mg | ORAL_TABLET | Freq: Two times a day (BID) | ORAL | Status: DC
  Administered 2014-12-20 – 2014-12-29 (×18): 3.125 mg via ORAL
  Filled 2014-12-20 (×19): qty 1

## 2014-12-20 MED ORDER — PANTOPRAZOLE SODIUM 40 MG PO TBEC
40.0000 mg | DELAYED_RELEASE_TABLET | Freq: Every day | ORAL | Status: DC
  Administered 2014-12-21 – 2014-12-29 (×9): 40 mg via ORAL
  Filled 2014-12-20 (×10): qty 1

## 2014-12-20 MED ORDER — SACCHAROMYCES BOULARDII 250 MG PO CAPS
250.0000 mg | ORAL_CAPSULE | Freq: Two times a day (BID) | ORAL | Status: DC
  Administered 2014-12-20 – 2014-12-29 (×18): 250 mg via ORAL
  Filled 2014-12-20 (×18): qty 1

## 2014-12-20 MED ORDER — GUAIFENESIN-DM 100-10 MG/5ML PO SYRP
5.0000 mL | ORAL_SOLUTION | Freq: Four times a day (QID) | ORAL | Status: DC | PRN
  Filled 2014-12-20: qty 10

## 2014-12-20 MED ORDER — FLEET ENEMA 7-19 GM/118ML RE ENEM: 1.0000 | ENEMA | Freq: Once | RECTAL | Status: AC | PRN

## 2014-12-20 MED ORDER — ACETAMINOPHEN 325 MG PO TABS
325.0000 mg | ORAL_TABLET | ORAL | Status: DC | PRN
  Administered 2014-12-25: 650 mg via ORAL
  Filled 2014-12-20: qty 2

## 2014-12-20 MED ORDER — TRAZODONE HCL 50 MG PO TABS
25.0000 mg | ORAL_TABLET | Freq: Every evening | ORAL | Status: DC | PRN
  Administered 2014-12-22: 50 mg via ORAL
  Filled 2014-12-20: qty 1

## 2014-12-20 MED ORDER — CARVEDILOL 3.125 MG PO TABS: 3.1250 mg | ORAL_TABLET | Freq: Two times a day (BID) | ORAL | Status: DC

## 2014-12-20 MED ORDER — CHLORHEXIDINE GLUCONATE 0.12 % MT SOLN
15.0000 mL | Freq: Two times a day (BID) | OROMUCOSAL | Status: DC
  Administered 2014-12-20 – 2014-12-28 (×7): 15 mL via OROMUCOSAL
  Filled 2014-12-20 (×19): qty 15

## 2014-12-20 MED ORDER — AMOXICILLIN-POT CLAVULANATE 875-125 MG PO TABS: 1.0000 | ORAL_TABLET | Freq: Two times a day (BID) | ORAL | Status: AC

## 2014-12-20 MED ORDER — OXYCODONE HCL 5 MG PO TABS
5.0000 mg | ORAL_TABLET | ORAL | Status: DC | PRN
  Administered 2014-12-20 – 2014-12-23 (×5): 10 mg via ORAL
  Administered 2014-12-26: 5 mg via ORAL
  Filled 2014-12-20 (×2): qty 2
  Filled 2014-12-20: qty 1
  Filled 2014-12-20 (×3): qty 2

## 2014-12-20 MED ORDER — AMLODIPINE BESYLATE 10 MG PO TABS
10.0000 mg | ORAL_TABLET | Freq: Every day | ORAL | Status: DC
  Administered 2014-12-20: 10 mg via ORAL
  Filled 2014-12-20: qty 1

## 2014-12-20 MED ORDER — ALBUTEROL SULFATE (2.5 MG/3ML) 0.083% IN NEBU
3.0000 mL | INHALATION_SOLUTION | Freq: Four times a day (QID) | RESPIRATORY_TRACT | Status: DC | PRN
  Administered 2014-12-21 – 2014-12-22 (×2): 3 mL via RESPIRATORY_TRACT
  Filled 2014-12-20 (×2): qty 3

## 2014-12-20 MED ORDER — PRO-STAT SUGAR FREE PO LIQD
30.0000 mL | Freq: Two times a day (BID) | ORAL | Status: DC
  Administered 2014-12-21 – 2014-12-24 (×2): 30 mL via ORAL
  Filled 2014-12-20 (×17): qty 30

## 2014-12-20 MED ORDER — ASPIRIN 81 MG PO CHEW
81.0000 mg | CHEWABLE_TABLET | Freq: Every day | ORAL | Status: DC
  Administered 2014-12-21 – 2014-12-29 (×9): 81 mg via ORAL
  Filled 2014-12-20 (×9): qty 1

## 2014-12-20 MED ORDER — POTASSIUM CHLORIDE CRYS ER 20 MEQ PO TBCR
20.0000 meq | EXTENDED_RELEASE_TABLET | Freq: Two times a day (BID) | ORAL | Status: AC
  Administered 2014-12-20 – 2014-12-21 (×2): 20 meq via ORAL
  Filled 2014-12-20 (×2): qty 1

## 2014-12-20 MED ORDER — POLYSACCHARIDE IRON COMPLEX 150 MG PO CAPS
150.0000 mg | ORAL_CAPSULE | Freq: Every day | ORAL | Status: DC
  Administered 2014-12-21 – 2014-12-29 (×9): 150 mg via ORAL
  Filled 2014-12-20 (×10): qty 1

## 2014-12-20 MED ORDER — DIPHENHYDRAMINE HCL 12.5 MG/5ML PO ELIX
12.5000 mg | ORAL_SOLUTION | Freq: Four times a day (QID) | ORAL | Status: DC | PRN
  Filled 2014-12-20: qty 10

## 2014-12-20 MED ORDER — CETYLPYRIDINIUM CHLORIDE 0.05 % MT LIQD
7.0000 mL | Freq: Two times a day (BID) | OROMUCOSAL | Status: DC
  Administered 2014-12-22 – 2014-12-25 (×6): 7 mL via OROMUCOSAL

## 2014-12-20 MED ORDER — SENNOSIDES-DOCUSATE SODIUM 8.6-50 MG PO TABS: 1.0000 | ORAL_TABLET | Freq: Every evening | ORAL | Status: DC | PRN

## 2014-12-20 MED ORDER — ASPIRIN 81 MG PO CHEW: 81.0000 mg | CHEWABLE_TABLET | Freq: Every day | ORAL | Status: DC

## 2014-12-20 MED ORDER — ATORVASTATIN CALCIUM 20 MG PO TABS
20.0000 mg | ORAL_TABLET | Freq: Every day | ORAL | Status: DC
  Administered 2014-12-20 – 2014-12-29 (×10): 20 mg via ORAL
  Filled 2014-12-20 (×10): qty 1

## 2014-12-20 MED ORDER — BISACODYL 10 MG RE SUPP: 10.0000 mg | Freq: Every day | RECTAL | Status: DC | PRN

## 2014-12-20 MED ORDER — ALUM & MAG HYDROXIDE-SIMETH 200-200-20 MG/5ML PO SUSP
30.0000 mL | ORAL | Status: DC | PRN
  Administered 2014-12-27: 30 mL via ORAL
  Filled 2014-12-20: qty 30

## 2014-12-20 MED ORDER — PIPERACILLIN-TAZOBACTAM 3.375 G IVPB
3.3750 g | Freq: Three times a day (TID) | INTRAVENOUS | Status: DC
  Administered 2014-12-20 – 2014-12-21 (×2): 3.375 g via INTRAVENOUS
  Filled 2014-12-20 (×4): qty 50

## 2014-12-20 MED ORDER — AMLODIPINE BESYLATE 10 MG PO TABS
10.0000 mg | ORAL_TABLET | Freq: Every day | ORAL | Status: DC
  Administered 2014-12-21 – 2014-12-29 (×9): 10 mg via ORAL
  Filled 2014-12-20 (×9): qty 1

## 2014-12-20 MED ORDER — ENOXAPARIN SODIUM 40 MG/0.4ML ~~LOC~~ SOLN
40.0000 mg | SUBCUTANEOUS | Status: DC
  Administered 2014-12-20 – 2014-12-28 (×9): 40 mg via SUBCUTANEOUS
  Filled 2014-12-20 (×9): qty 0.4

## 2014-12-20 NOTE — Progress Notes (Signed)
Trish Mage, RN Rehab Admission Coordinator Signed Physical Medicine and Rehabilitation PMR Pre-admission 12/20/2014 11:49 AM  Related encounter: Admission (Discharged) from 12/16/2014 in [facility] 6E MEDICAL/RENAL    Expand All Collapse All   PMR Admission Coordinator Pre-Admission Assessment  Patient: Valerie Sellers is an 59 y.o., female MRN: [id number] DOB: December 17, 1955 Height:  (160 cm) Weight: 67.384 kg (148 lb 8.9 oz)  Insurance Information HMO: PPO: PCP: IPA: 80/20: OTHER:  PRIMARY: Medicaid [place] access Policy#: [id number] Subscriber: [name] CM Name: Phone#: Fax#:  Pre-Cert#: Employer: Disabled Benefits: Phone #: [phone] Name: Automated Eff. Date: Eligible 12/20/14 Deduct: Out of Pocket Max: Life Max:  CIR: SNF:  Outpatient: Co-Pay:  Home Health: Co-Pay:  DME: Co-Pay:  Providers:   Emergency Contact Information Contact Information    Name Relation Home Work Mobile   Sellers,Valerie Daughter   [phone]   Sellers, Valerie   [phone]     Current Medical History  Patient Admitting Diagnosis: Debility, Lap chole  History of Present Illness: A 59 y.o. female with history of HTN, asthma, recent Lap Chole 12/12/14 who was admitted to [facility] on 12/14/14 with bile leak and HIDA with ERCP attempted without success. Post procedure, patient developed tachycardia and significant abdominal pain with bloating. She underwent percutaneous drain placement by IR but continued to have high output therefore patient was transferred to [facility] on 12/16/14 for treatment. She underwent ERCP with sphincterotomy by Dr. Ewing Schlein on 07/16 with recommendations for follow up in 2 months for stent remove  al. PT evaluation done yesterday and CIR recommended for follow up therapy. Patient reports that she sat up for 2 hours yesterday.   Past Medical History  Past Medical History  Diagnosis Date  . Hypertension   . Hypercholesteremia   . Asthma   . Diverticulosis     Family History  family history includes Asthma in her father; Cancer in her father; Lung cancer in her brother; Stomach cancer in her mother.  Prior Rehab/Hospitalizations:  Has the patient had major surgery during 100 days prior to admission? No  Current Medications   Current facility-administered medications:  . acetaminophen (TYLENOL) tablet 650 mg, 650 mg, Oral, Q6H PRN, 650 mg at 12/17/14 2336 **OR** [DISCONTINUED] acetaminophen (TYLENOL) suppository 650 mg, 650 mg, Rectal, Q6H PRN, Therisa Doyne, MD . albuterol (PROVENTIL) (2.5 MG/3ML) 0.083% nebulizer solution 3 mL, 3 mL, Inhalation, Q6H PRN, Therisa Doyne, MD . amLODipine (NORVASC) tablet 10 mg, 10 mg, Oral, Daily, Leroy Sea, MD, 10 mg at 12/20/14 1045 . antiseptic oral rinse (CPC / CETYLPYRIDINIUM CHLORIDE 0.05%) solution 7 mL, 7 mL, Mouth Rinse, q12n4p, McAdmits Triadhosp, MD, 7 mL at 12/19/14 1758 . aspirin chewable tablet 81 mg, 81 mg, Oral, Daily, Leroy Sea, MD, 81 mg at 12/20/14 1045 . atorvastatin (LIPITOR) tablet 20 mg, 20 mg, Oral, q1800, Leroy Sea, MD, 20 mg at 12/19/14 1804 . carvedilol (COREG) tablet 3.125 mg, 3.125 mg, Oral, BID WC, Leroy Sea, MD, 3.125 mg at 12/20/14 0811 . chlorhexidine (PERIDEX) 0.12 % solution 15 mL, 15 mL, Mouth Rinse, BID, McAdmits Triadhosp, MD, 15 mL at 12/20/14 0811 . heparin injection 5,000 Units, 5,000 Units, Subcutaneous, 3 times per day, Leroy Sea, MD, 5,000 Units at 12/20/14 0553 . hydrALAZINE (APRESOLINE) injection 10 mg, 10 mg, Intravenous, Q6H PRN, Leroy Sea, MD . HYDROmorphone (DILAUDID) injection 0.5 mg, 0.5 mg, Intravenous, Q3H PRN,  Therisa Doyne, MD, 0.5 mg at 12/20/14 0549 . metoprolol (LOPRESSOR) injection 5 mg,  5 mg, Intravenous, Q4H PRN, Leroy Sea, MD . [DISCONTINUED] ondansetron (ZOFRAN) tablet 4 mg, 4 mg, Oral, Q6H PRN **OR** ondansetron (ZOFRAN) injection 4 mg, 4 mg, Intravenous, Q6H PRN, Therisa Doyne, MD . pantoprazole (PROTONIX) EC tablet 40 mg, 40 mg, Oral, Daily, Leroy Sea, MD, 40 mg at 12/20/14 1045 . piperacillin-tazobactam (ZOSYN) IVPB 3.375 g, 3.375 g, Intravenous, 3 times per day, Therisa Doyne, MD, 3.375 g at 12/20/14 0549 . sodium chloride 0.9 % injection 3 mL, 3 mL, Intravenous, Q12H, Therisa Doyne, MD, 3 mL at 12/19/14 2109  Patients Current Diet: DIET SOFT Room service appropriate?: Yes; Fluid consistency:: Thin Diet - low sodium heart healthy  Precautions / Restrictions Precautions Precautions: Fall Restrictions Weight Bearing Restrictions: No   Has the patient had 2 or more falls or a fall with injury in the past year?No  Prior Activity Level Community (5-7x/wk): Went out daily. Has not worked since 2000. Dtr and neighbor drive her where she needs to go.  Home Assistive Devices / Equipment Home Assistive Devices/Equipment: None Home Equipment: None  Prior Device Use: Indicate devices/aids used by the patient prior to current illness, exacerbation or injury? None of the above  Prior Functional Level Prior Function Level of Independence: Independent Comments: Reports children help with meals and housekeeping  Self Care: Did the patient need help bathing, dressing, using the toilet or eating? Independent  Indoor Mobility: Did the patient need assistance with walking from room to room (with or without device)? Independent  Stairs: Did the patient need assistance with internal or external stairs (with or without device)? Independent  Functional Cognition: Did the patient need help planning regular tasks such as shopping or remembering to take  medications? Needed some help. Dtr and neighbor took her shopping. Dtr gets medications for her at times when she cannot afford them.  Current Functional Level Cognition  Overall Cognitive Status: Within Functional Limits for tasks assessed Orientation Level: Oriented X4 Following Commands: Follows one step commands inconsistently, Follows one step commands with increased time   Extremity Assessment (includes Sensation/Coordination)  Upper Extremity Assessment: Generalized weakness  Lower Extremity Assessment: Generalized weakness    ADLs  Anticipate ADL deficits and needs for OT interventions    Mobility  General bed mobility comments: Sitting EOB upon arrival    Transfers  Overall transfer level: Needs assistance Equipment used: Rolling walker (2 wheeled) Transfers: Sit to/from Stand Sit to Stand: Min assist General transfer comment: Cues for safe hand placement and to scoot to edge of bed. Slow to respond to cueing. Required assist to power up to standing and for balance.    Ambulation / Gait / Stairs / Wheelchair Mobility  Ambulation/Gait Ambulation/Gait assistance: Mod assist Ambulation Distance (Feet): 50 Feet Assistive device: Rolling walker (2 wheeled) (second person to push chair behind) Gait Pattern/deviations: Decreased step length - right, Decreased step length - left, Decreased stride length, Decreased stance time - right, Decreased stance time - left, Trunk flexed General Gait Details: Short, halting steps in RW; cues for more stable body position relative to RW (pt stepping toofar in front of wheels, putting her at risk of posterior loss of balance); Mod assist mostly for RW advancement and management Gait velocity: very slow    Posture / Balance Balance Overall balance assessment: Needs assistance Standing balance support: Bilateral upper extremity supported Standing balance-Leahy Scale: Poor    Special needs/care consideration  BiPAP/CPAP No CPM No Continuous Drip IV No Dialysis No  Life Vest No Oxygen No Special  Bed No Trach Size No Wound Vac (area) No  Skin Bruises easily. Has abdominal dressing in place  Bowel mgmt: Last BM 12/19/14 Bladder mgmt: Using bedpan to void Diabetic mgmt No    Previous Home Environment Living Arrangements: Alone, Children (Patient reports daughter, son/girlfriend and 2 grandchildren) Available Help at Discharge: Family, Available 24 hours/day (Patient reports son is at home) Type of Home: House Home Layout: One level Home Access: Stairs to enter Entrance Stairs-Rails: None Entrance Stairs-Number of Steps: 3 Home Care Services: No Additional Comments: Information per patient - needs to be verified. Per patient, she helped with grandchildren - 534 yo and 61 yo.  Discharge Living Setting Plans for Discharge Living Setting: Patient's home, House, Lives with (comment) (Lives with dtr, son and 2 grandchildren.) Type of Home at Discharge: House Discharge Home Layout: One level. Note: Has 1 step from hallway down to bathroom in her home. Discharge Home Access: Stairs to enter Entrance Stairs-Number of Steps: 3 steps entry Does the patient have any problems obtaining your medications?: No  Social/Family/Support Systems Patient Roles: Parent (Has a daughter and 2 sons.) Contact Information: Judie PetitLisa Imhof - daughter [phone] Anticipated Caregiver: Dtr, son Ability/Limitations of Caregiver: Dtr works. Son in the home and not currently working. Caregiver Availability: 24/7 Discharge Plan Discussed with Primary Caregiver: Yes Is Caregiver In Agreement with Plan?: Yes Does Caregiver/Family have Issues with Lodging/Transportation while Pt is in Rehab?: No  Goals/Additional Needs Patient/Family Goal for Rehab: PT/OT mod I goals Expected length of stay: 7 days Cultural Considerations: None Dietary Needs: Soft diet, thin liquids Equipment  Needs: TBD Pt/Family Agrees to Admission and willing to participate: Yes Program Orientation Provided & Reviewed with Pt/Caregiver Including Roles & Responsibilities: Yes  Decrease burden of Care through IP rehab admission: N/A  Possible need for SNF placement upon discharge: Not anticipated  Patient Condition: This patient's condition remains as documented in the consult dated 12/19/14, in which the Rehabilitation Physician determined and documented that the patient's condition is appropriate for intensive rehabilitative care in an inpatient rehabilitation facility. Will admit to inpatient rehab today.  Preadmission Screen Completed By: Trish MageLogue, Calob Baskette M, 12/20/2014 12:01 PM ______________________________________________________________________  Discussed status with Dr. Riley KillSwartz on 12/20/14 at 1201 and received telephone approval for admission today.  Admission Coordinator: Trish MageLogue, Graviela Nodal M, time1201/Date07/19/16          Cosigned by: Ranelle OysterZachary T Swartz, MD at 12/20/2014 1:07 PM  Revision History     Date/Time User Provider Type Action   12/20/2014 1:07 PM Ranelle OysterZachary T Swartz, MD Physician Cosign   12/20/2014 12:02 PM Trish MageEugenia M Madhavi Hamblen, RN Rehab Admission Coordinator Sign

## 2014-12-20 NOTE — Progress Notes (Signed)
Subjective: No abdominal pain. Tolerating diet.  Objective: Vital signs in last 24 hours: Temp:  [98 F (36.7 C)-99.9 F (37.7 C)] 98.4 F (36.9 C) (07/19 0811) Pulse Rate:  [79-96] 84 (07/19 0811) Resp:  [16-18] 16 (07/19 0811) BP: (126-164)/(84-100) 158/100 mmHg (07/19 0811) SpO2:  [92 %-96 %] 95 % (07/19 0811) Weight:  [67.384 kg (148 lb 8.9 oz)] 67.384 kg (148 lb 8.9 oz) (07/19 0424) Weight change: 0.184 kg (6.5 oz) Last BM Date: 12/19/14  PE: GEN: Deconditioned-appearing, NAD ABD:  Soft, RUQ drain last 24 hours 655 cc bilious output  Lab Results: CBC    Component Value Date/Time   WBC 6.4 12/19/2014 0524   RBC 3.81* 12/19/2014 0524   HGB 11.7* 12/19/2014 0524   HCT 34.5* 12/19/2014 0524   PLT 152 12/19/2014 0524   MCV 90.6 12/19/2014 0524   MCH 30.7 12/19/2014 0524   MCHC 33.9 12/19/2014 0524   RDW 13.5 12/19/2014 0524   LYMPHSABS 0.7 12/17/2014 0244   MONOABS 1.2* 12/17/2014 0244   EOSABS 0.0 12/17/2014 0244   BASOSABS 0.0 12/17/2014 0244   CMP     Component Value Date/Time   NA 140 12/19/2014 0524   K 3.4* 12/20/2014 0528   CL 102 12/19/2014 0524   CO2 32 12/19/2014 0524   GLUCOSE 109* 12/19/2014 0524   BUN 7 12/19/2014 0524   CREATININE 0.52 12/19/2014 0524   CALCIUM 7.5* 12/19/2014 0524   PROT 4.8* 12/19/2014 0524   ALBUMIN 1.6* 12/19/2014 0524   AST 21 12/19/2014 0524   ALT 9* 12/19/2014 0524   ALKPHOS 80 12/19/2014 0524   BILITOT 2.2* 12/19/2014 0524   GFRNONAA >60 12/19/2014 0524   GFRAA >60 12/19/2014 0524   Assessment:  1.  Bile leak, post-operative, s/p ERCP with biliary sphincterotomy and bile duct stent placement.  Overall slow downtrend in drain output though more yesterday cf the day immediately after ERCP.  Plan:  1.  Advanced diet as tolerated. 2.  Follow JP drain output.  Would give things a few days to discern whether or not bile duct stent is functional and/or needs to be changed/exchanged. 3.  Will  follow.   Valerie Sellers,Valerie Sellers M 12/20/2014, 11:56 AM   Pager [phone] If no answer or after 5 PM call [phone]

## 2014-12-20 NOTE — Care Management Note (Signed)
Case Management Note  Patient Details  Name: Valerie Sellers MRN: [id number] Date of Birth: 02/16/1956  Subjective/Objective:         CM following for progression and d/c planning.           Action/Plan: Consult for DME , however pt to d/c to CIR, therefore any DME and HH needs will be addressed at the time of d/c from that facility.  Expected Discharge Date:       12/20/2014           Expected Discharge Plan:  IP Rehab Facility  In-House Referral:  Clinical Social Work  Discharge planning Services  CM Consult  Post Acute Care Choice:  NA Choice offered to:  NA  DME Arranged:    DME Agency:     HH Arranged:    HH Agency:     Status of Service:  Completed, signed off  Medicare Important Message Given:    Date Medicare IM Given:    Medicare IM give by:    Date Additional Medicare IM Given:    Additional Medicare Important Message give by:     If discussed at Long Length of Stay Meetings, dates discussed:    Additional Comments:  Starlyn SkeansRoyal, Milos Milligan U, RN 12/20/2014, 10:41 AM

## 2014-12-20 NOTE — Discharge Instructions (Signed)
Follow with Primary MD Simone CuriaLEE,KEUNG, MD your gastroenterologist and interventional radiologist in [place] in 7 days   Get CBC, CMP, 2 view Chest X ray checked  by Primary MD next visit.    Activity: As tolerated with Full fall precautions use walker/cane & assistance as needed   Disposition SNF/CIR   Diet: Heart Healthy with feeding assistance and aspiration precautions.  For Heart failure patients - Check your Weight same time everyday, if you gain over 2 pounds, or you develop in leg swelling, experience more shortness of breath or chest pain, call your Primary MD immediately. Follow Cardiac Low Salt Diet and 1.5 lit/day fluid restriction.   On your next visit with your primary care physician please Get Medicines reviewed and adjusted.   Please request your Prim.MD to go over all Hospital Tests and Procedure/Radiological results at the follow up, please get all Hospital records sent to your Prim MD by signing hospital release before you go home.   If you experience worsening of your admission symptoms, develop shortness of breath, life threatening emergency, suicidal or homicidal thoughts you must seek medical attention immediately by calling 911 or calling your MD immediately  if symptoms less severe.  You Must read complete instructions/literature along with all the possible adverse reactions/side effects for all the Medicines you take and that have been prescribed to you. Take any new Medicines after you have completely understood and accpet all the possible adverse reactions/side effects.   Do not drive, operating heavy machinery, perform activities at heights, swimming or participation in water activities or provide baby sitting services if your were admitted for syncope or siezures until you have seen by Primary MD or a Neurologist and advised to do so again.  Do not drive when taking Pain medications.    Do not take more than prescribed Pain, Sleep and Anxiety  Medications  Special Instructions: If you have smoked or chewed Tobacco  in the last 2 yrs please stop smoking, stop any regular Alcohol  and or any Recreational drug use.  Wear Seat belts while driving.   Please note  You were cared for by a hospitalist during your hospital stay. If you have any questions about your discharge medications or the care you received while you were in the hospital after you are discharged, you can call the unit and asked to speak with the hospitalist on call if the hospitalist that took care of you is not available. Once you are discharged, your primary care physician will handle any further medical issues. Please note that NO REFILLS for any discharge medications will be authorized once you are discharged, as it is imperative that you return to your primary care physician (or establish a relationship with a primary care physician if you do not have one) for your aftercare needs so that they can reassess your need for medications and monitor your lab values.

## 2014-12-20 NOTE — H&P (Signed)
Physical Medicine and Rehabilitation Admission H&P   CC: Deconditioning.    HPI: Valerie Sellers is a 59 y.o. female with history of HTN, asthma, recent Lap Chole 12/12/14 who was admitted to [facility] on 12/14/14 with bile leak and HIDA with ERCP attempted without success. Post procedure, patient developed tachycardia and significant abdominal pain with bloating. She underwent percutaneous drain placement by IR but continued to have high output therefore patient was transferred to [facility] on 12/16/14 for treatment. She underwent ERCP with sphincterotomy by Dr. Watt Climes on 07/16 with recommendations for follow up in 2 months for stent removal. She continues to have output via drainage and GI following for input. Patient noted to be deconditioned and CIR recommended for follow up therapy.   Review of Systems  HENT: Negative for hearing loss.  Eyes: Negative for blurred vision and double vision.  Respiratory: Negative for cough and shortness of breath.  Cardiovascular: Negative for chest pain and palpitations.  Gastrointestinal: Positive for nausea, abdominal pain and diarrhea.  Genitourinary: Negative for dysuria and urgency.  Musculoskeletal: Negative for myalgias and joint pain.  Neurological: Positive for dizziness (with activity) and weakness (chronic). Negative for speech change, focal weakness and headaches.  Psychiatric/Behavioral: The patient does not have insomnia.     Past Medical History  Diagnosis Date  . Hypertension   . Hypercholesteremia   . Asthma   . Diverticulosis     Past Surgical History  Procedure Laterality Date  . Cholecystectomy  12/12/2014  . Ercp N/A 12/17/2014    Procedure: ENDOSCOPIC RETROGRADE CHOLANGIOPANCREATOGRAPHY (ERCP); Surgeon: Clarene Essex, MD; Location: [facility] ENDOSCOPY; Service: Endoscopy; Laterality: N/A;    Family History  Problem Relation Age of Onset  . Stomach cancer Mother   . Asthma Father     . Cancer Father   . Lung cancer Brother     Social History: Lives with family. 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  Multiple family members at home who can assist past discharge. She reports that she has never smoked. She does not have any smokeless tobacco history on file. She reports that she does not drink alcohol or use illicit drugs.    Allergies: No Known Allergies    Medications Prior to Admission  Medication Sig Dispense Refill  . albuterol (PROVENTIL HFA;VENTOLIN HFA) 108 (90 BASE) MCG/ACT inhaler Inhale 1 puff into the lungs every 6 (six) hours as needed for wheezing or shortness of breath.    [name] alendronate (FOSAMAX) 70 MG tablet Take 70 mg by mouth once a week.  3  . atorvastatin (LIPITOR) 20 MG tablet Take 20 mg by mouth daily.    . Cholecalciferol (VITAMIN D3) 5000 UNITS CAPS Take 1 capsule by mouth daily.    [name] lisinopril (PRINIVIL,ZESTRIL) 20 MG tablet Take 20 mg by mouth daily.    [name] loratadine (CLARITIN) 10 MG tablet Take 10 mg by mouth daily.    . montelukast (SINGULAIR) 10 MG tablet Take 10 mg by mouth daily.     [name] omeprazole (PRILOSEC) 20 MG capsule Take 20 mg by mouth daily.    . ondansetron (ZOFRAN) 4 MG tablet Take 4 mg by mouth every 6 (six) hours as needed for nausea or vomiting.   0  . [DISCONTINUED] oxyCODONE (OXY IR/ROXICODONE) 5 MG immediate release tablet Take 5 mg by mouth every 4 (four) hours as needed for moderate pain.   0    Home: Home Living Family/patient expects to be discharged  to:: Private residence Living Arrangements: Alone, Children (Patient reports daughter, son/girlfriend and 2 grandchildren) Available Help at Discharge: Family, Available 24 hours/day (Patient reports son is at home) Type of Home: House Home Access: Stairs to enter [employer] of Steps: 3 Entrance Stairs-Rails: None Home Layout: One  level Home Equipment: None Additional Comments: Information per patient - needs to be verified. Per patient, she helped with grandchildren - 37 yo and 3 yo.  Functional History: Prior Function Level of Independence: Independent Comments: Reports children help with meals and housekeeping  Functional Status:  Mobility: Bed Mobility General bed mobility comments: Sitting EOB upon arrival Transfers Overall transfer level: Needs assistance Equipment used: Rolling walker (2 wheeled) Transfers: Sit to/from Stand Sit to Stand: Min assist General transfer comment: Cues for safe hand placement and to scoot to edge of bed. Slow to respond to cueing. Required assist to power up to standing and for balance. Ambulation/Gait Ambulation/Gait assistance: Mod assist Ambulation Distance (Feet): 50 Feet Assistive device: Rolling walker (2 wheeled) (second person to push chair behind) Gait Pattern/deviations: Decreased step length - right, Decreased step length - left, Decreased stride length, Decreased stance time - right, Decreased stance time - left, Trunk flexed General Gait Details: Short, halting steps in RW; cues for more stable body position relative to RW (pt stepping toofar in front of wheels, putting her at risk of posterior loss of balance); Mod assist mostly for RW advancement and management Gait velocity: very slow    ADL:    Cognition: Cognition Overall Cognitive Status: Within Functional Limits for tasks assessed ( ) Orientation Level: Oriented X4 Cognition Arousal/Alertness: Awake/alert Behavior During Therapy: Flat affect Overall Cognitive Status: Within Functional Limits for tasks assessed ( ) Area of Impairment: Orientation, Following commands, Problem solving Orientation Level: Disoriented to, Place, Time Following Commands: Follows one step commands inconsistently, Follows one step commands with increased time Problem Solving: Slow processing, Decreased initiation,  Difficulty sequencing, Requires verbal cues     Blood pressure 158/100, pulse 84, temperature 98.4 F (36.9 C), temperature source Oral, resp. rate 16, height $RemoveBe'5\' 3"'rmMqIbJEI$  (1.6 m), weight 67.384 kg (148 lb 8.9 oz), SpO2 95 %. Physical Exam  Nursing note and vitals reviewed. Constitutional: She is oriented to person, place, and time. She appears well-developed and well-nourished.  HENT: oral mucosa slightly dry. edentulous Head: Normocephalic and atraumatic.  Eyes: Conjunctivae are normal. Pupils are equal, round, and reactive to light.  Neck: Normal range of motion. Neck supple.  Cardiovascular: Normal rate and regular rhythm.no murmurs  Respiratory: Effort normal and breath sounds normal.  GI: Soft. Bowel sounds are normal. She exhibits distension. There is tenderness along the right abdomen Patent drain RUQ. Multiple dry dressings on abdomen.  Musculoskeletal: She exhibits no edema or tenderness.  Neurological: She is alert and oriented to person, place, and time.  Dysarthric speech (baseline speech impediment?) She is able to follow basic commands without difficulty. UE's 4/5 deltoid, bicep, tricep, wrist, hand.  LE: 3/5 hf, 4-/5 ke and 4/5 ankles. No gross sensory deficits in any limb.  Has fair awareness but insight appears very basic. Seems slow to process and engage Skin: Skin is warm and dry. Abdomen noted above Psych: pt generally cooperative. Rarely smiles or makes eye contact.    Lab Results Last 48 Hours    Results for orders placed or performed during the hospital encounter of 12/16/14 (from the past 48 hour(s))  Glucose, capillary Status: Abnormal   Collection Time: 12/19/14 4:15 AM  Result Value  Ref Range   Glucose-Capillary 119 (H) 65 - 99 mg/dL  CBC Status: Abnormal   Collection Time: 12/19/14 5:24 AM  Result Value Ref Range   WBC 6.4 4.0 - 10.5 K/uL   RBC 3.81 (L) 3.87 - 5.11 MIL/uL   Hemoglobin 11.7 (L) 12.0 - 15.0 g/dL    HCT 34.5 (L) 36.0 - 46.0 %   MCV 90.6 78.0 - 100.0 fL   MCH 30.7 26.0 - 34.0 pg   MCHC 33.9 30.0 - 36.0 g/dL   RDW 13.5 11.5 - 15.5 %   Platelets 152 150 - 400 K/uL  Comprehensive metabolic panel Status: Abnormal   Collection Time: 12/19/14 5:24 AM  Result Value Ref Range   Sodium 140 135 - 145 mmol/L   Potassium 3.0 (L) 3.5 - 5.1 mmol/L   Chloride 102 101 - 111 mmol/L   CO2 32 22 - 32 mmol/L   Glucose, Bld 109 (H) 65 - 99 mg/dL   BUN 7 6 - 20 mg/dL   Creatinine, Ser 0.52 0.44 - 1.00 mg/dL   Calcium 7.5 (L) 8.9 - 10.3 mg/dL   Total Protein 4.8 (L) 6.5 - 8.1 g/dL   Albumin 1.6 (L) 3.5 - 5.0 g/dL   AST 21 15 - 41 U/L   ALT 9 (L) 14 - 54 U/L   Alkaline Phosphatase 80 38 - 126 U/L   Total Bilirubin 2.2 (H) 0.3 - 1.2 mg/dL   GFR calc non Af Amer >60 >60 mL/min   GFR calc Af Amer >60 >60 mL/min    Comment: (NOTE) The eGFR has been calculated using the CKD EPI equation. This calculation has not been validated in all clinical situations. eGFR's persistently <60 mL/min signify possible Chronic Kidney Disease.    Anion gap 6 5 - 15  Potassium Status: Abnormal   Collection Time: 12/20/14 5:28 AM  Result Value Ref Range   Potassium 3.4 (L) 3.5 - 5.1 mmol/L      Imaging Results (Last 48 hours)    No results found.       Medical Problem List and Plan: 1. Functional deficits secondary to debility to complications after lap chole 2. DVT Prophylaxis/Anticoagulation: Pharmaceutical: Lovenox 3. Pain Management: Will d/c dilaudid and order oxycodone for use prn. Discussed with patient.  -adjust regimen as appropriate 4. Mood: LCSW to follow for evaluation and support.  5. Neuropsych: This patient is capable of making decisions on her own behalf. 6. Skin/Wound Care: Routine pressure relief measures. Monitor drain output tid and cleanse around drain site tid.    7. Fluids/Electrolytes/Nutrition: Monitor I/O. Check lytes in am. -pt's appetite has been poor. Will have dietician follow up with patient, discussed intake with patient today  8. HTN: Poorly controlled. Will monitor every 8 hour and adjust medications as indicated. Norvasc added today. -add clonidine prn for severe elevations  9. Tachycardia: On coreg bid. Likely due to pain/deconditioning--observe--adjust meds as appropriate 10. Hypokalemia: supplement today and recheck in am. 11. Diarrhea: Likely due to [place] surgery as well as antibiotics. Will add probiotic.  12. ABLA: Recheck labs in am. No signs of blood loss on exam 13. SOB: Resolved with diuresis. Encourage IS while awake.  3. Post op biliary leak with biliary peritonitis: has been on IV zosyn every 8 hours with recommendations to transition to Augmentin bid for 4 additional days.   Post Admission Physician Evaluation: 1. Functional deficits secondary To debility to complications after lap chole 2. Patient is admitted to receive collaborative, interdisciplinary care between  the physiatrist, rehab nursing staff, and therapy team. 3. Patient's level of medical complexity and substantial therapy needs in context of that medical necessity cannot be provided at a lesser intensity of care such as a SNF. 4. Patient has experienced substantial functional loss from his/her baseline which was documented above under the "Functional History" and "Functional Status" headings. Judging by the patient's diagnosis, physical exam, and functional history, the patient has potential for functional progress which will result in measurable gains while on inpatient rehab. These gains will be of substantial and practical use upon discharge in facilitating mobility and self-care at the household level. 5. Physiatrist will provide 24 hour management of medical needs as well as oversight of the therapy plan/treatment and provide guidance as appropriate  regarding the interaction of the two. 6. 24 hour rehab nursing will assist with bladder management, bowel management, safety, skin/wound care, disease management, medication administration, pain management and patient education and help integrate therapy concepts, techniques,education, etc. 7. PT will assess and treat for/with: Lower extremity strength, range of motion, stamina, balance, functional mobility, safety, adaptive techniques and equipment, activity tolerance, pain control, observance wound care needs. Family ed, coping skills. Goals are: mod I. 8. OT will assess and treat for/with: ADL's, functional mobility, safety, upper extremity strength, adaptive techniques and equipment, pain mgt, coping skills, ego support, family ed. Goals are: mod I to supervision. Therapy may not yet proceed with showering this patient. 9. SLP will assess and treat for/with: n/a. Goals are: n/a. 10. Case Management and Social Worker will assess and treat for psychological issues and discharge planning. 11. Team conference will be held weekly to assess progress toward goals and to determine barriers to discharge. 12. Patient will receive at least 3 hours of therapy per day at least 5 days per week. 13. ELOS: 10-14 days  14. Prognosis: excellent     Meredith Staggers, MD, [facility] 12/20/2014

## 2014-12-20 NOTE — Progress Notes (Signed)
Rehab admissions - I met with patient this morning.  She would like to admit to acute inpatient rehab.  Bed available and will admit to acute inpatient rehab today.  Call me for questions.  #[phone]

## 2014-12-20 NOTE — Progress Notes (Signed)
Pt being discharged to 4W inpatient rehab via wheelchair. Pt alert and oriented x3. VSS. Pt c/o no pain at this time. No signs of respiratory distress. Education complete and care plans resolved. IV remaining for antibiotic therapy in rehab.  No further issues at this time. Jillyn HiddenStone,Kennah Hehr R, RN

## 2014-12-20 NOTE — Progress Notes (Signed)
OT Cancellation Note  Patient Details Name: Valerie CohoSandra Tyrell MRN: [id number] DOB: 09/07/1955   Cancelled Treatment:    Reason Eval/Treat Not Completed: Other (comment). Pt to be admitted to inpatient rehab today--will defer eval to that venue.  Evette GeorgesLeonard, Irvin Lizama Eva [phone] 12/20/2014, 2:11 PM

## 2014-12-20 NOTE — PMR Pre-admission (Signed)
PMR Admission Coordinator Pre-Admission Assessment  Patient: Valerie Sellers is an 59 y.o., female MRN: [id number] DOB: 12-14-1955 Height:  (160 cm) Weight: 67.384 kg (148 lb 8.9 oz)              Insurance Information HMO:      PPO:       PCP:       IPA:       80/20:       OTHER:   PRIMARY: Medicaid [place] access      Policy#: [id number]      Subscriber: [name] CM Name:        Phone#:       Fax#:   Pre-Cert#:        Employer: Disabled Benefits:  Phone #: [phone]     Name: Automated Eff. Date: Eligible 12/20/14     Deduct:        Out of Pocket Max:        Life Max:   CIR:        SNF:   Outpatient:       Co-Pay:   Home Health:        Co-Pay:   DME:       Co-Pay:   Providers:   Emergency Contact Information Contact Information    Name Relation Home Work Mobile   Huesca,Lisa Daughter   [phone]   Berlyn, Saylor   [phone]     Current Medical History  Patient Admitting Diagnosis:  Debility, Lap chole  History of Present Illness: A 59 y.o. female with history of HTN, asthma, recent Lap Chole 12/12/14 who was admitted to [facility] on 12/14/14 with bile leak and HIDA with ERCP attempted without success. Post procedure, patient developed tachycardia and significant abdominal pain with bloating. She underwent percutaneous drain placement by IR but continued to have high output therefore patient was transferred to [facility] on 12/16/14 for treatment. She underwent ERCP with sphincterotomy by Dr. Ewing Schlein on 07/16 with recommendations for follow up in 2 months for stent remove al. PT evaluation done yesterday and CIR recommended for follow up therapy. Patient reports that she sat up for 2 hours yesterday.    Past Medical History  Past Medical History  Diagnosis Date  . Hypertension   . Hypercholesteremia   . Asthma   . Diverticulosis     Family History  family history includes Asthma in her father; Cancer in her father; Lung cancer in her brother; Stomach cancer in her mother.  Prior  Rehab/Hospitalizations:  Has the patient had major surgery during 100 days prior to admission? No  Current Medications   Current facility-administered medications:  .  acetaminophen (TYLENOL) tablet 650 mg, 650 mg, Oral, Q6H PRN, 650 mg at 12/17/14 2336 **OR** [DISCONTINUED] acetaminophen (TYLENOL) suppository 650 mg, 650 mg, Rectal, Q6H PRN, Therisa Doyne, MD .  albuterol (PROVENTIL) (2.5 MG/3ML) 0.083% nebulizer solution 3 mL, 3 mL, Inhalation, Q6H PRN, Therisa Doyne, MD .  amLODipine (NORVASC) tablet 10 mg, 10 mg, Oral, Daily, Leroy Sea, MD, 10 mg at 12/20/14 1045 .  antiseptic oral rinse (CPC / CETYLPYRIDINIUM CHLORIDE 0.05%) solution 7 mL, 7 mL, Mouth Rinse, q12n4p, McAdmits Triadhosp, MD, 7 mL at 12/19/14 1758 .  aspirin chewable tablet 81 mg, 81 mg, Oral, Daily, Leroy Sea, MD, 81 mg at 12/20/14 1045 .  atorvastatin (LIPITOR) tablet 20 mg, 20 mg, Oral, q1800, Leroy Sea, MD, 20 mg at 12/19/14 1804 .  carvedilol (COREG) tablet 3.125 mg, 3.125  mg, Oral, BID WC, Leroy SeaPrashant K Singh, MD, 3.125 mg at 12/20/14 0811 .  chlorhexidine (PERIDEX) 0.12 % solution 15 mL, 15 mL, Mouth Rinse, BID, McAdmits Triadhosp, MD, 15 mL at 12/20/14 0811 .  heparin injection 5,000 Units, 5,000 Units, Subcutaneous, 3 times per day, Leroy SeaPrashant K Singh, MD, 5,000 Units at 12/20/14 0553 .  hydrALAZINE (APRESOLINE) injection 10 mg, 10 mg, Intravenous, Q6H PRN, Leroy SeaPrashant K Singh, MD .  HYDROmorphone (DILAUDID) injection 0.5 mg, 0.5 mg, Intravenous, Q3H PRN, Therisa DoyneAnastassia Doutova, MD, 0.5 mg at 12/20/14 0549 .  metoprolol (LOPRESSOR) injection 5 mg, 5 mg, Intravenous, Q4H PRN, Leroy SeaPrashant K Singh, MD .  [DISCONTINUED] ondansetron (ZOFRAN) tablet 4 mg, 4 mg, Oral, Q6H PRN **OR** ondansetron (ZOFRAN) injection 4 mg, 4 mg, Intravenous, Q6H PRN, Therisa DoyneAnastassia Doutova, MD .  pantoprazole (PROTONIX) EC tablet 40 mg, 40 mg, Oral, Daily, Leroy SeaPrashant K Singh, MD, 40 mg at 12/20/14 1045 .  piperacillin-tazobactam (ZOSYN)  IVPB 3.375 g, 3.375 g, Intravenous, 3 times per day, Therisa DoyneAnastassia Doutova, MD, 3.375 g at 12/20/14 0549 .  sodium chloride 0.9 % injection 3 mL, 3 mL, Intravenous, Q12H, Therisa DoyneAnastassia Doutova, MD, 3 mL at 12/19/14 2109  Patients Current Diet: DIET SOFT Room service appropriate?: Yes; Fluid consistency:: Thin Diet - low sodium heart healthy  Precautions / Restrictions Precautions Precautions: Fall Restrictions Weight Bearing Restrictions: No   Has the patient had 2 or more falls or a fall with injury in the past year?No  Prior Activity Level Community (5-7x/wk): Went out daily.  Has not worked since 2000.  Dtr and neighbor drive her where she needs to go.  Home Assistive Devices / Equipment Home Assistive Devices/Equipment: None Home Equipment: None  Prior Device Use: Indicate devices/aids used by the patient prior to current illness, exacerbation or injury? None of the above  Prior Functional Level Prior Function Level of Independence: Independent Comments: Reports children help with meals and housekeeping  Self Care: Did the patient need help bathing, dressing, using the toilet or eating?  Independent  Indoor Mobility: Did the patient need assistance with walking from room to room (with or without device)? Independent  Stairs: Did the patient need assistance with internal or external stairs (with or without device)? Independent  Functional Cognition: Did the patient need help planning regular tasks such as shopping or remembering to take medications? Needed some help.  Dtr and neighbor took her shopping.  Dtr gets medications for her at times when she cannot afford them.  Current Functional Level Cognition  Overall Cognitive Status: Within Functional Limits for tasks assessed Orientation Level: Oriented X4 Following Commands: Follows one step commands inconsistently, Follows one step commands with increased time    Extremity Assessment (includes Sensation/Coordination)  Upper  Extremity Assessment: Generalized weakness  Lower Extremity Assessment: Generalized weakness    ADLs  Anticipate ADL deficits and needs for OT interventions    Mobility  General bed mobility comments: Sitting EOB upon arrival    Transfers  Overall transfer level: Needs assistance Equipment used: Rolling walker (2 wheeled) Transfers: Sit to/from Stand Sit to Stand: Min assist General transfer comment: Cues for safe hand placement and to scoot to edge of bed.  Slow to respond to cueing.  Required assist to power up to standing and for balance.    Ambulation / Gait / Stairs / Wheelchair Mobility  Ambulation/Gait Ambulation/Gait assistance: Mod assist Ambulation Distance (Feet): 50 Feet Assistive device: Rolling walker (2 wheeled) (second person to push chair behind) Gait Pattern/deviations: Decreased step length -  right, Decreased step length - left, Decreased stride length, Decreased stance time - right, Decreased stance time - left, Trunk flexed General Gait Details: Short, halting steps in RW; cues for more stable body position relative to RW (pt stepping toofar in front of wheels, putting her at risk of posterior loss of balance); Mod assist mostly for RW advancement and management Gait velocity: very slow    Posture / Balance Balance Overall balance assessment: Needs assistance Standing balance support: Bilateral upper extremity supported Standing balance-Leahy Scale: Poor    Special needs/care consideration BiPAP/CPAP No CPM No Continuous Drip IV No Dialysis No        Life Vest  No Oxygen No Special Bed No Trach Size No Wound Vac (area) No     Skin Bruises easily.  Has abdominal dressing in place                            Bowel mgmt: Last BM 12/19/14 Bladder mgmt: Using bedpan to void Diabetic mgmt No    Previous Home Environment Living Arrangements: Alone, Children (Patient reports daughter, son/girlfriend and 2 grandchildren) Available Help at Discharge: Family,  Available 24 hours/day (Patient reports son is at home) Type of Home: House Home Layout: One level Home Access: Stairs to enter Entrance Stairs-Rails: None Entrance Stairs-Number of Steps: 3 Home Care Services: No Additional Comments: Information per patient - needs to be verified.  Per patient, she helped with grandchildren - 32 yo and 28 yo.  Discharge Living Setting Plans for Discharge Living Setting: Patient's home, House, Lives with (comment) (Lives with dtr, son and 2 grandchildren.) Type of Home at Discharge: House Discharge Home Layout: One level.  Note:  Has 1 step from hallway down to bathroom in her home. Discharge Home Access: Stairs to enter Entrance Stairs-Number of Steps: 3 steps entry Does the patient have any problems obtaining your medications?: No  Social/Family/Support Systems Patient Roles: Parent (Has a daughter and 2 sons.) Contact Information: Medora Roorda - daughter [phone] Anticipated Caregiver: Dtr, son Ability/Limitations of Caregiver: Dtr works.  Son in the home and not currently working. Caregiver Availability: 24/7 Discharge Plan Discussed with Primary Caregiver: Yes Is Caregiver In Agreement with Plan?: Yes Does Caregiver/Family have Issues with Lodging/Transportation while Pt is in Rehab?: No  Goals/Additional Needs Patient/Family Goal for Rehab: PT/OT mod I goals Expected length of stay: 7 days Cultural Considerations: None Dietary Needs: Soft diet, thin liquids Equipment Needs: TBD Pt/Family Agrees to Admission and willing to participate: Yes Program Orientation Provided & Reviewed with Pt/Caregiver Including Roles  & Responsibilities: Yes  Decrease burden of Care through IP rehab admission: N/A  Possible need for SNF placement upon discharge: Not anticipated  Patient Condition: This patient's condition remains as documented in the consult dated 12/19/14, in which the Rehabilitation Physician determined and documented that the patient's  condition is appropriate for intensive rehabilitative care in an inpatient rehabilitation facility. Will admit to inpatient rehab today.  Preadmission Screen Completed By:  Trish Mage, 12/20/2014 12:01 PM ______________________________________________________________________   Discussed status with Dr. Riley Kill on 12/20/14 at 1201 and received telephone approval for admission today.  Admission Coordinator:  Trish Mage, time1201/Date07/19/16

## 2014-12-20 NOTE — Progress Notes (Signed)
Ranelle OysterZachary T Swartz, MD Physician Signed Physical Medicine and Rehabilitation Consult Note 12/19/2014 8:12 AM  Related encounter: [facility]Dis[facility]    Expand All Collapse All        Physical Medicine and Rehabilitation Consult  Reason for Consult: Deconditioning Referring Physician: Dr. Thedore MinsSingh   HPI: Katrine CohoSandra Sellers is a 59 y.o. female with history of HTN, asthma, recent Lap Chole 12/12/14 who [facility] with bile leak and HIDA with ERCP attempted without success. Post procedure, patient developed tachycardia and significant abdominal pain with bloating. She underwent percutaneous drain placement by IR but continued to have high output therefore [facility]. She underwent ERCP with sphincterotomy by Dr. Ewing SchleinMagod on 07/16 with recommendations for follow up in 2 months for stent remove al. PT evaluation done yesterday and CIR recommended for follow up therapy. Patient reports that she sat up for 2 hours yesterday   Review of Systems  HENT: Negative for hearing loss.  Respiratory: Positive for shortness of breath (with activity). Negative for cough.  Cardiovascular: Negative for chest pain and palpitations.  Gastrointestinal: Negative for heartburn and nausea.  Genitourinary: Negative for dysuria and urgency.  Musculoskeletal: Positive for myalgias and back pain.  Neurological: Negative for dizziness, tingling, seizures and headaches.  Psychiatric/Behavioral: Negative for memory loss. The patient is nervous/anxious.     Past Medical History  Diagnosis Date  . Hypertension   . Hypercholesteremia   . Asthma   . Diverticulosis     Past Surgical History  Procedure Laterality Date  . Cholecystectomy  12/12/2014    Family History  Problem Relation Age of Onset  . Stomach cancer Mother   . Asthma Father   . Cancer  Father   . Lung cancer Brother     Social History: Lives with family. 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 Multiple family members at home who can assist past discharge. alone. Has family who can check in PTA. reports that she has never smoked. She does not have any smokeless tobacco history on file. She reports that she does not drink alcohol or use illicit drugs.    Allergies: No Known Allergies    Medications Prior to Admission  Medication Sig Dispense Refill  . albuterol (PROVENTIL HFA;VENTOLIN HFA) 108 (90 BASE) MCG/ACT inhaler Inhale 1 puff into the lungs every 6 (six) hours as needed for wheezing or shortness of breath.    [name]. alendronate (FOSAMAX) 70 MG tablet Take 70 mg by mouth once a week.  3  . atorvastatin (LIPITOR) 20 MG tablet Take 20 mg by mouth daily.    . Cholecalciferol (VITAMIN D3) 5000 UNITS CAPS Take 1 capsule by mouth daily.    [name]. lisinopril (PRINIVIL,ZESTRIL) 20 MG tablet Take 20 mg by mouth daily.    [name]. loratadine (CLARITIN) 10 MG tablet Take 10 mg by mouth daily.    . montelukast (SINGULAIR) 10 MG tablet Take 10 mg by mouth daily.     [name]. omeprazole (PRILOSEC) 20 MG capsule Take 20 mg by mouth daily.    . ondansetron (ZOFRAN) 4 MG tablet Take 4 mg by mouth every 6 (six) hours as needed for nausea or vomiting.   0  . [DISCONTINUED] oxyCODONE (OXY IR/ROXICODONE) 5 MG immediate release tablet Take 5 mg by mouth every 4 (four) hours as needed for moderate pain.   0    Home:  Home Living Family/patient expects to be discharged to:: Private residence Living Arrangements: Alone, Children (Patient reports daughter, son/girlfriend and 2 grandchildren) Available Help at Discharge: Family, Available 24 hours/day (Patient reports son is at home) Type of Home: House Home Access: Stairs to enter [employer] of Steps: 3 Entrance Stairs-Rails: None Home  Layout: One level Home Equipment: None Additional Comments: Information per patient - needs to be verified. Per patient, she helped with grandchildren - 25 yo and 69 yo.  Functional History: Prior Function Level of Independence: Independent Comments: Reports children help with meals and housekeeping Functional Status:  Mobility:   Transfers Overall transfer level: Needs assistance Equipment used: 1 person hand held assist Transfers: Sit to/from Stand Sit to Stand: Min assist General transfer comment: Cues for safe hand placement and to scoot to edge of chair. Slow to respond to cueing or to ask for assist when unable to stand initially. Required assist to power up to standing and for balance. Ambulation/Gait Ambulation/Gait assistance: Mod assist Ambulation Distance (Feet): 1 Feet Assistive device: 1 person hand held assist Gait Pattern/deviations: Shuffle, Festinating General Gait Details: Verbal cues to step forward. Patient unsteady in stance and with attempts to take steps. LE's shaking. Difficulty stepping with RLE. Knees buckling. Returned to sitting. Will need +2 assist at next session.    ADL:    Cognition: Cognition Overall Cognitive Status: No family/caregiver present to determine baseline cognitive functioning Orientation Level: Oriented X4 Cognition Arousal/Alertness: Awake/alert Behavior During Therapy: Flat affect Overall Cognitive Status: No family/caregiver present to determine baseline cognitive functioning Area of Impairment: Orientation, Following commands, Problem solving Orientation Level: Disoriented to, Place, Time Following Commands: Follows one step commands inconsistently, Follows one step commands with increased time Problem Solving: Slow processing, Decreased initiation, Difficulty sequencing, Requires verbal cues  Blood pressure 132/71, pulse 88, temperature 98 F (36.7 C), temperature source Oral, resp. rate 18, height  (1.6 m),  weight 67.2 kg (148 lb 2.4 oz), SpO2 93 %. Physical Exam  Nursing note and vitals reviewed. Constitutional: She is oriented to person, place, and time. She appears well-developed and well-nourished.  HENT:  Head: Normocephalic and atraumatic.  Eyes: Conjunctivae are normal. Pupils are equal, round, and reactive to light.  Neck: Normal range of motion. Neck supple.  Cardiovascular: Normal rate and regular rhythm.  Respiratory: Effort normal and breath sounds normal. No respiratory distress. She has no wheezes.  GI: Soft. Bowel sounds are normal. She exhibits no distension. There is no tenderness.  Drain with clear yellow fluid.  Musculoskeletal: She exhibits no tenderness.  Neurological: She is alert and oriented to person, place, and time.  Flat affect. Dysarthric speech. UE's 4/5 prox to distal. LE: 3/5 hf, 4-/5 ke and 4/5 ankles. No gross sensory deficits. Exam limited by abdominal pain. Has fair awareness but insight appears very basic.  Skin: Skin is warm and dry.  Psychiatric:  Flat and disengaged     Lab Results Last 24 Hours    Results for orders placed or performed during the hospital encounter of 12/16/14 (from the past 24 hour(s))  Glucose, capillary Status: Abnormal   Collection Time: 12/19/14 4:15 AM  Result Value Ref Range   Glucose-Capillary 119 (H) 65 - 99 mg/dL  CBC Status: Abnormal   Collection Time: 12/19/14 5:24 AM  Result Value Ref Range   WBC 6.4 4.0 - 10.5 K/uL   RBC 3.81 (L) 3.87 - 5.11 MIL/uL   Hemoglobin 11.7 (L) 12.0 - 15.0 g/dL   HCT 40.9 (L)  36.0 - 46.0 %   MCV 90.6 78.0 - 100.0 fL   MCH 30.7 26.0 - 34.0 pg   MCHC 33.9 30.0 - 36.0 g/dL   RDW 57.8 46.9 - 62.9 %   Platelets 152 150 - 400 K/uL  Comprehensive metabolic panel Status: Abnormal   Collection Time: 12/19/14 5:24 AM  Result Value Ref Range   Sodium 140 135 - 145 mmol/L   Potassium 3.0 (L) 3.5 - 5.1  mmol/L   Chloride 102 101 - 111 mmol/L   CO2 32 22 - 32 mmol/L   Glucose, Bld 109 (H) 65 - 99 mg/dL   BUN 7 6 - 20 mg/dL   Creatinine, Ser 5.28 0.44 - 1.00 mg/dL   Calcium 7.5 (L) 8.9 - 10.3 mg/dL   Total Protein 4.8 (L) 6.5 - 8.1 g/dL   Albumin 1.6 (L) 3.5 - 5.0 g/dL   AST 21 15 - 41 U/L   ALT 9 (L) 14 - 54 U/L   Alkaline Phosphatase 80 38 - 126 U/L   Total Bilirubin 2.2 (H) 0.3 - 1.2 mg/dL   GFR calc non Af Amer >60 >60 mL/min   GFR calc Af Amer >60 >60 mL/min   Anion gap 6 5 - 15      Imaging Results (Last 48 hours)    Dg Ercp Biliary & Pancreatic Ducts  12/17/2014 CLINICAL DATA: Common bile duct stent placement EXAM: ERCP TECHNIQUE: Multiple spot images obtained with the fluoroscopic device and submitted for interpretation post-procedure. FLUOROSCOPY TIME: Radiation Exposure Index (as provided by the fluoroscopic device): Not available If the device does not provide the exposure index: Fluoroscopy Time: 12 minutes 39 seconds Number of Acquired Images: 2 COMPARISON: None. FINDINGS: Injection was performed the common bile duct and reveals no definitive filling defect. Correlation with the operative findings is recommended. IMPRESSION: No definitive common bile duct filling defect. These images were submitted for radiologic interpretation only. Please see the procedural report for the amount of contrast and the fluoroscopy time utilized. Electronically Signed By: Alcide Clever M.D. On: 12/17/2014 16:16     Assessment/Plan: Diagnosis: debility to complications after lap chole. 1. Does the need for close, 24 hr/day medical supervision in concert with the patient's rehab needs make it unreasonable for this patient to be served in a less intensive setting? Yes 2. Co-Morbidities requiring supervision/potential complications: htn, pain, wound care, bp control 3. Due to bladder management, bowel management,  safety, skin/wound care, disease management, medication administration, pain management and patient education, does the patient require 24 hr/day rehab nursing? Yes 4. Does the patient require coordinated care of a physician, rehab nurse, PT (1-2 hrs/day, 5 days/week) and OT (1-2 hrs/day, 5 days/week) to address physical and functional deficits in the context of the above medical diagnosis(es)? Yes Addressing deficits in the following areas: balance, endurance, locomotion, strength, transferring, bowel/bladder control, bathing, dressing, feeding, grooming, toileting and psychosocial support 5. Can the patient actively participate in an intensive therapy program of at least 3 hrs of therapy per day at least 5 days per week? Yes 6. The potential for patient to make measurable gains while on inpatient rehab is excellent 7. Anticipated functional outcomes upon discharge from inpatient rehab are modified independent with PT, modified independent with OT, n/a with SLP. 8. Estimated rehab length of stay to reach the above functional goals is: 7 days 9. Does the patient have adequate social supports and living environment to accommodate these discharge functional goals? Yes 10. Anticipated D/C setting: Home 11. Anticipated  post D/C treatments: HH therapy 12. Overall Rehab/Functional Prognosis: excellent  RECOMMENDATIONS: This patient's condition is appropriate for continued rehabilitative care in the following setting: CIR Patient has agreed to participate in recommended program. Yes Note that insurance prior authorization may be required for reimbursement for recommended care.  Comment: Rehab Admissions Coordinator to follow up.  Thanks,  Ranelle Oyster, MD, [place] [facility]     12/19/2014       Revision History     Date/Time User Provider Type Action   12/19/2014 2:51 PM Ranelle Oyster, MD Physician Sign   12/19/2014 9:10 AM Jacquelynn Cree, PA-C Physician Assistant Share   View Details  Report       Routing History     Date/Time From To Method   12/19/2014 2:51 PM Ranelle Oyster, MD Ranelle Oyster, MD In Basket   12/19/2014 2:51 PM Ranelle Oyster, MD Simone Curia, MD Fax

## 2014-12-20 NOTE — Discharge Summary (Signed)
Valerie Sellers, is a 59 y.o. female  DOB [birth year]  MRN [id number].  Admission date:  12/16/2014  Admitting Physician  Therisa Doyne, MD  Discharge Date:  12/20/2014   Primary MD  Simone Curia, MD  Recommendations for primary care physician for things to follow:   Monitor gallbladder drain output. Empty 3 times a day.   She must follow with her PCP, her primary gastroenterologist in IR physician in [place] within a week   One-time outpatient cardiology follow-up.   Kindly review the echocardiogram report which shows a suspicious cystic lesion in her liver. Needs outpatient CT scan for full evaluation.   Check CBC, CMP and a 2 view chest x-ray in a week.   Admission Diagnosis  BILE LEAK ERCP CBD stones   Discharge Diagnosis  BILE LEAK ERCP CBD stones    Active Problems:   Bile leak, postoperative   Ileus, postoperative   Essential hypertension   Hypoxia      Past Medical History  Diagnosis Date  . Hypertension   . Hypercholesteremia   . Asthma   . Diverticulosis     Past Surgical History  Procedure Laterality Date  . Cholecystectomy  12/12/2014  . Ercp N/A 12/17/2014    Procedure: ENDOSCOPIC RETROGRADE CHOLANGIOPANCREATOGRAPHY (ERCP);  Surgeon: [name], MD;  Location: [facility] ENDOSCOPY;  Service: Endoscopy;  Laterality: N/A;       HPI  from the history and physical done on the day of admission:    Valerie Sellers is a 59 y.o. female has a past medical history of Hypertension; Hypercholesteremia; Asthma; and Diverticulosis.   On July 11 patient undergone laparoscopic cholecystectomy with intraoperative course angiogram. Once 13 th July patient presented to [facility] with complaints of upper quadrant pain. HIDA scan showed bile leak. ERCP was done on 13 th July showed evidence of  slight biliary leak of contrast into biliary fossa. Normal cholangiogram and normal pancreatogram. Unfortunately Biliary system was never deeply cannulated and sphincterotomy or stent placement could not be performed On July 14 patient developed tachypnea and hypertension secondary to significant abdominal pain and bloating. She was transferred to ICU. IR placed percutaneous drain.  Patient continued to have high output from the drain and GI consult recommended transfer to Medical Center for ERCP with sphincterectomy and stent placement. Case was discussed with Dr. Loreta Ave who will consult.  Throughout her hospital stay patient apparently remained afebrile. She is on third day of Ancef 1 g every 6 hours. Patient continued to have elevated low blood cell count 18.9.   Patient noted to have abdominal distention and minimal flatness as well as small bowel movements. X-ray was done showing dilated small and large intestine. She was thought to have significant ileus. NG tube placed.   Currently she endorses ongoing abdominal pain but states that instead of right upper quadrant pain now her pain is suprapubic and diffuse more centralized. She endorses passing small amount of flatness very small bowel movement.   He has been tachycardic throughout her hospital stay which was  thought to be secondary to pain she denies any chest pain no shortness of breath no lower extremity swelling. Currently appears to be comfortable  Hospitalist was called for admission for bile leak needs GI consult for ERCP     Hospital Course:     1. Postcholecystectomy biliary leak and biliary peritonitis with a unsuccessful ERCP done at [facility]. Initial surgery for cholecystectomy was on July 11, ERCP was on July 13, underwent biliary drain placement by IR at [facility] on July 14.  Continued to have generalized pain, was seen by GI physician Dr. Rexanne Mano on 12/17/2014 and underwent ERCP with  sphincterotomy and bloody stent placement, she looks and feels better. Tolerating diet, labs improved. Still has drainage from her gallbladder drain. Overall symptom free will discharge to SNF. Place on 4 more days of oral Augmentin and stop IV anti-biotics.  Requested SNF staff to empty gallbladder drain 3 times a day and monitor output. Patient must follow with her gastroenterologist and IR physician at [facility] within a week. Recheck CBC CMP and a 2 view chest x-ray within a week.   2. Shortness of breath. Minimal shortness of breath today on exam, now no shortness of breath and without oxygen, does have bilateral atelectasis, he has improved with IS and trial of Lasix . We will place on nebulizer treatments as needed. Monitor closely. Does have history of asthma but with no wheezing at this time. Stable echocardiogram. Continue incentive spirometer every hour at SNF while awake.   3. EKG with nonspecific changes and mildly elevated troponin. Troponin trend and non-ACS pattern likely mildly high due to biliary peritonitis from #1 above, chest pain-free, stable limited echocardiogram , placed on aspirin along with low-dose beta blocker on no statin. Denies any previous CAD or smoking history, nondiabetic. Symptom-free. One-time outpatient cardiology follow-up.   4. Dyslipidemia. Statin resumed.    5. Hypokalemia. Replaced again, recheck BMP in 5-7 days at [facility].    6. Possible cystic shadow in the liver noted on echocardiogram. Follow with her primary gastroenterologist, obtain a dedicated abdominal pelvis CT scan for evaluation.   7. Hypertension. Continue home dose ACE inhibitor, added Coreg. Monitor and adjust medications as needed.    Discharge Condition: Stable  Follow UP  Follow-up Information    Follow up with [facility], MD. Schedule an appointment as soon as possible for a visit in 1 week.   Specialty:  Internal Medicine   Why:  Your interventional radiologist who placed a  biliary drain in 1-2 weeks. Along with your gastroenterologist in [place] in one week.   Contact information:   [address], [address]  [place] [address] [phone]       Follow up with [facility], MD. Schedule an appointment as soon as possible for a visit in 2 weeks.   Specialty:  Gastroenterology   Contact information:   [address] [place] [address] [phone]       Follow up with Willa Rough, MD. Schedule an appointment as soon as possible for a visit in 1 week.   Specialty:  Cardiology   Contact information:   [address] [place] [address] [phone]        Consults obtained - GI  Diet and Activity recommendation: See Discharge Instructions below  Discharge Instructions       Discharge Instructions    Diet - low sodium heart healthy    Complete by:  As directed  Discharge instructions    Complete by:  As directed   Follow with Primary MD Simone CuriaLEE,KEUNG, MD your gastroenterologist and interventional radiologist in [place] in 7 days   Get CBC, CMP, 2 view Chest X ray checked  by Primary MD next visit.    Activity: As tolerated with Full fall precautions use walker/cane & assistance as needed   Disposition SNF/CIR   Diet: Heart Healthy with feeding assistance and aspiration precautions.  For Heart failure patients - Check your Weight same time everyday, if you gain over 2 pounds, or you develop in leg swelling, experience more shortness of breath or chest pain, call your Primary MD immediately. Follow Cardiac Low Salt Diet and 1.5 lit/day fluid restriction.   On your next visit with your primary care physician please Get Medicines reviewed and adjusted.   Please request your Prim.MD to go over all Hospital Tests and Procedure/Radiological results at the follow up, please get all Hospital records sent to your Prim MD by signing hospital release before you go home.   If you experience worsening  of your admission symptoms, develop shortness of breath, life threatening emergency, suicidal or homicidal thoughts you must seek medical attention immediately by calling 911 or calling your MD immediately  if symptoms less severe.  You Must read complete instructions/literature along with all the possible adverse reactions/side effects for all the Medicines you take and that have been prescribed to you. Take any new Medicines after you have completely understood and accpet all the possible adverse reactions/side effects.   Do not drive, operating heavy machinery, perform activities at heights, swimming or participation in water activities or provide baby sitting services if your were admitted for syncope or siezures until you have seen by Primary MD or a Neurologist and advised to do so again.  Do not drive when taking Pain medications.    Do not take more than prescribed Pain, Sleep and Anxiety Medications  Special Instructions: If you have smoked or chewed Tobacco  in the last 2 yrs please stop smoking, stop any regular Alcohol  and or any Recreational drug use.  Wear Seat belts while driving.   Please note  You were cared for by a hospitalist during your hospital stay. If you have any questions about your discharge medications or the care you received while you were in the hospital after you are discharged, you can call the unit and asked to speak with the hospitalist on call if the hospitalist that took care of you is not available. Once you are discharged, your primary care physician will handle any further medical issues. Please note that NO REFILLS for any discharge medications will be authorized once you are discharged, as it is imperative that you return to your primary care physician (or establish a relationship with a primary care physician if you do not have one) for your aftercare needs so that they can reassess your need for medications and monitor your lab values.     Increase  activity slowly    Complete by:  As directed              Discharge Medications       Medication List    TAKE these medications        albuterol 108 (90 BASE) MCG/ACT inhaler  Commonly known as:  PROVENTIL HFA;VENTOLIN HFA  Inhale 1 puff into the lungs every 6 (six) hours as needed for wheezing or shortness of breath.     alendronate 70 MG tablet  Commonly known as:  FOSAMAX  Take 70 mg by mouth once a week.     amoxicillin-clavulanate 875-125 MG per tablet  Commonly known as:  AUGMENTIN  Take 1 tablet by mouth 2 (two) times daily.     aspirin 81 MG chewable tablet  Chew 1 tablet (81 mg total) by mouth daily.     atorvastatin 20 MG tablet  Commonly known as:  LIPITOR  Take 20 mg by mouth daily.     carvedilol 3.125 MG tablet  Commonly known as:  COREG  Take 1 tablet (3.125 mg total) by mouth 2 (two) times daily with a meal.     lisinopril 20 MG tablet  Commonly known as:  PRINIVIL,ZESTRIL  Take 20 mg by mouth daily.     loratadine 10 MG tablet  Commonly known as:  CLARITIN  Take 10 mg by mouth daily.     montelukast 10 MG tablet  Commonly known as:  SINGULAIR  Take 10 mg by mouth daily.     omeprazole 20 MG capsule  Commonly known as:  PRILOSEC  Take 20 mg by mouth daily.     ondansetron 4 MG tablet  Commonly known as:  ZOFRAN  Take 4 mg by mouth every 6 (six) hours as needed for nausea or vomiting.     oxyCODONE 5 MG immediate release tablet  Commonly known as:  Oxy IR/ROXICODONE  Take 1 tablet (5 mg total) by mouth every 4 (four) hours as needed for moderate pain.     Vitamin D3 5000 UNITS Caps  Take 1 capsule by mouth daily.        Major procedures and Radiology Reports - PLEASE review detailed and final reports for all details, in brief -    ERCP. Done by Dr. Ewing Schlein on 12/17/2014, underwent spondylectomy with stent placement.  Echocardiogram   Left ventricle: The cavity size was normal. Wall thickness was normal. Systolic function was  normal. The estimated ejection fraction was in the range of 55% to 60%. Images were inadequate for LV wall motion assessment. There was an increased relative contribution of atrial contraction to ventricular filling. Doppler parameters are consistent with abnormal left ventricular relaxation (grade 1 diastolic dysfunction).  Impressions:  - There is a large cystic structure in the vicinity of the liver that could be a large hepatic cyst. Recommend further imaging to evaluate. Very poor acoustical windows due to breast implants. Cannot comment on valvular disease or wall motion. Consider a Cardiac MRI if cilnically indicated.   Dg Chest Port 1 View  12/17/2014   CLINICAL DATA:  History of asthma.  History of hypertension.  EXAM: PORTABLE CHEST - 1 VIEW  COMPARISON:  None.  FINDINGS: Nasogastric tube is in place with tip to level of the distal stomach. Film is made with low lung volumes. Heart size is probably normal. There is small left pleural effusion. Opacity at the left lung base obscures the medial left hemidiaphragm consistent atelectasis or infiltrate. Consider PA and lateral chest if the patient is able.  IMPRESSION: 1. Small left pleural effusion. 2. Left lower lobe atelectasis or infiltrate.   Electronically Signed   By: Norva Pavlov M.D.   On: 12/17/2014 08:06   Dg Ercp Biliary & Pancreatic Ducts  12/17/2014   CLINICAL DATA:  Common bile duct stent placement  EXAM: ERCP  TECHNIQUE: Multiple spot images obtained with the fluoroscopic device and submitted for interpretation post-procedure.  FLUOROSCOPY TIME:  Radiation Exposure Index (as provided by the fluoroscopic  device): Not available  If the device does not provide the exposure index:  Fluoroscopy Time:  12 minutes 39 seconds  Number of Acquired Images:  2  COMPARISON:  None.  FINDINGS: Injection was performed the common bile duct and reveals no definitive filling defect. Correlation with the operative findings  is recommended.  IMPRESSION: No definitive common bile duct filling defect.  These images were submitted for radiologic interpretation only. Please see the procedural report for the amount of contrast and the fluoroscopy time utilized.   Electronically Signed   By: Alcide Clever M.D.   On: 12/17/2014 16:16   Dg Abd Portable 1v  12/17/2014   CLINICAL DATA:  Hypoxia.  EXAM: PORTABLE ABDOMEN - 1 VIEW  COMPARISON:  Chest x-ray same day  FINDINGS: Single supine image of the abdomen demonstrates residual contrast in the ascending colon. Bowel gas pattern is nonobstructive. Percutaneous catheter has a right lateral approach in the right upper quadrant. Visualized osseous structures have a normal appearance.  IMPRESSION: 1. Nonobstructive bowel gas pattern. 2. Percutaneous catheter right upper quadrant.   Electronically Signed   By: Norva Pavlov M.D.   On: 12/17/2014 08:07    Micro Results      Recent Results (from the past 240 hour(s))  MRSA PCR Screening     Status: None   Collection Time: 12/16/14 11:15 PM  Result Value Ref Range Status   MRSA by PCR NEGATIVE NEGATIVE Final    Comment:        The GeneXpert MRSA Assay (FDA approved for NASAL specimens only), is one component of a comprehensive MRSA colonization surveillance program. It is not intended to diagnose MRSA infection nor to guide or monitor treatment for MRSA infections.   Culture, body fluid-bottle     Status: None (Preliminary result)   Collection Time: 12/17/14  1:25 AM  Result Value Ref Range Status   Specimen Description FLUID BILE  Final   Special Requests NONE  Final   Culture NO GROWTH 2 DAYS  Final   Report Status PENDING  Incomplete  Gram stain     Status: None   Collection Time: 12/17/14  1:25 AM  Result Value Ref Range Status   Specimen Description FLUID BILE  Final   Special Requests NONE  Final   Gram Stain WBCPM NO ORGANISMS SEEN  Final   Report Status 12/17/2014 FINAL  Final  Urine culture     Status:  None   Collection Time: 12/17/14  4:45 AM  Result Value Ref Range Status   Specimen Description URINE, CLEAN CATCH  Final   Special Requests NONE  Final   Culture NO GROWTH 1 DAY  Final   Report Status 12/18/2014 FINAL  Final       Today   Subjective    Valerie Sellers today has no headache,no chest abdominal pain,no new weakness tingling or numbness, feels much better.    Objective   Blood pressure 158/100, pulse 84, temperature 98.4 F (36.9 C), temperature source Oral, resp. rate 16, height 5\' 3"  (1.6 m), weight 67.384 kg (148 lb 8.9 oz), SpO2 95 %.   Intake/Output Summary (Last 24 hours) at 12/20/14 1041 Last data filed at 12/20/14 1029  Gross per 24 hour  Intake    630 ml  Output   1681 ml  Net  -1051 ml    Exam Awake Alert, Oriented x 3, No new F.N deficits, Normal affect [place].AT,PERRAL Supple Neck,No JVD, No cervical lymphadenopathy appriciated.  Symmetrical Chest wall  movement, Good air movement bilaterally, CTAB RRR,No Gallops,Rubs or new Murmurs, No Parasternal Heave +ve B.Sounds, Abd Soft, Non tender, No organomegaly appriciated, No rebound -guarding or rigidity. Right-sided gallbladder drain stable. No Cyanosis, Clubbing or edema, No new Rash or bruise   Data Review   CBC w Diff: Lab Results  Component Value Date   WBC 6.4 12/19/2014   HGB 11.7* 12/19/2014   HCT 34.5* 12/19/2014   PLT 152 12/19/2014   LYMPHOPCT 5* 12/17/2014   MONOPCT 9 12/17/2014   EOSPCT 0 12/17/2014   BASOPCT 0 12/17/2014    CMP: Lab Results  Component Value Date   NA 140 12/19/2014   K 3.4* 12/20/2014   CL 102 12/19/2014   CO2 32 12/19/2014   BUN 7 12/19/2014   CREATININE 0.52 12/19/2014   PROT 4.8* 12/19/2014   ALBUMIN 1.6* 12/19/2014   BILITOT 2.2* 12/19/2014   ALKPHOS 80 12/19/2014   AST 21 12/19/2014   ALT 9* 12/19/2014  .   Total Time in preparing paper work, data evaluation and todays exam - 35 minutes  Leroy Sea M.D on 12/20/2014 at 10:41 AM  [facility]   Office  [phone]

## 2014-12-21 ENCOUNTER — Inpatient Hospital Stay (HOSPITAL_COMMUNITY): Payer: Medicaid Other | Admitting: Physical Therapy

## 2014-12-21 ENCOUNTER — Inpatient Hospital Stay (HOSPITAL_COMMUNITY): Payer: Medicaid Other | Admitting: Occupational Therapy

## 2014-12-21 LAB — COMPREHENSIVE METABOLIC PANEL
ALBUMIN: 2 g/dL — AB (ref 3.5–5.0)
ALT: 13 U/L — ABNORMAL LOW (ref 14–54)
AST: 24 U/L (ref 15–41)
Alkaline Phosphatase: 93 U/L (ref 38–126)
Anion gap: 9 (ref 5–15)
BUN: 7 mg/dL (ref 6–20)
CALCIUM: 7.8 mg/dL — AB (ref 8.9–10.3)
CHLORIDE: 98 mmol/L — AB (ref 101–111)
CO2: 27 mmol/L (ref 22–32)
CREATININE: 0.48 mg/dL (ref 0.44–1.00)
GFR calc Af Amer: >60 mL/min (ref >60)
GFR calc non Af Amer: >60 mL/min (ref >60)
Glucose, Bld: 107 mg/dL — ABNORMAL HIGH (ref 65–99)
Potassium: 3.4 mmol/L — ABNORMAL LOW (ref 3.5–5.1)
Sodium: 134 mmol/L — ABNORMAL LOW (ref 135–145)
TOTAL PROTEIN: 6 g/dL — AB (ref 6.5–8.1)
Total Bilirubin: 2.5 mg/dL — ABNORMAL HIGH (ref 0.3–1.2)

## 2014-12-21 LAB — CBC WITH DIFFERENTIAL/PLATELET
Basophils Absolute: 0 10*3/uL (ref 0.0–0.1)
Basophils Relative: 0 % (ref 0–1)
EOS ABS: 0.1 10*3/uL (ref 0.0–0.7)
EOS PCT: 1 % (ref 0–5)
HEMATOCRIT: 38 % (ref 36.0–46.0)
Hemoglobin: 13.2 g/dL (ref 12.0–15.0)
LYMPHS PCT: 7 % — AB (ref 12–46)
Lymphs Abs: 0.8 10*3/uL (ref 0.7–4.0)
MCH: 31.4 pg (ref 26.0–34.0)
MCHC: 34.7 g/dL (ref 30.0–36.0)
MCV: 90.5 fL (ref 78.0–100.0)
MONO ABS: 0.8 10*3/uL (ref 0.1–1.0)
Monocytes Relative: 7 % (ref 3–12)
NEUTROS ABS: 9.8 10*3/uL — AB (ref 1.7–7.7)
NEUTROS PCT: 85 % — AB (ref 43–77)
Platelets: 196 10*3/uL (ref 150–400)
RBC: 4.2 MIL/uL (ref 3.87–5.11)
RDW: 13.3 % (ref 11.5–15.5)
WBC: 11.5 10*3/uL — ABNORMAL HIGH (ref 4.0–10.5)

## 2014-12-21 MED ORDER — AMOXICILLIN-POT CLAVULANATE 875-125 MG PO TABS
1.0000 | ORAL_TABLET | Freq: Two times a day (BID) | ORAL | Status: DC
  Administered 2014-12-21 – 2014-12-29 (×17): 1 via ORAL
  Filled 2014-12-21 (×18): qty 1

## 2014-12-21 NOTE — Evaluation (Signed)
Occupational Therapy Assessment and Plan  Patient Details  Name: Valerie Sellers MRN: [id number] Date of Birth: May 25, 1956  OT Diagnosis: abnormal posture, acute pain, cognitive deficits and muscle weakness (generalized) Rehab Potential: Rehab Potential (ACUTE ONLY): Good ELOS: 7-10 days   Today's Date: 12/21/2014 OT Individual Time: 1045-1200 OT Individual Time Calculation (min): 75 min     Problem List:  Patient Active Problem List   Diagnosis Date Noted  . Physical deconditioning 12/20/2014  . Acute blood loss anemia 12/20/2014  . Ileus, postoperative 12/17/2014  . Essential hypertension 12/17/2014  . Hypoxia   . Bile leak, postoperative 12/16/2014    Past Medical History:  Past Medical History  Diagnosis Date  . Hypertension   . Hypercholesteremia   . Asthma   . Diverticulosis    Past Surgical History:  Past Surgical History  Procedure Laterality Date  . Cholecystectomy  12/12/2014  . Ercp N/A 12/17/2014    Procedure: ENDOSCOPIC RETROGRADE CHOLANGIOPANCREATOGRAPHY (ERCP);  Surgeon: Clarene Essex, MD;  Location: [facility] ENDOSCOPY;  Service: Endoscopy;  Laterality: N/A;    Assessment & Plan Clinical Impression: Valerie Sellers is a 59 y.o. female with history of HTN, asthma, recent Lap Chole 12/12/14 who was admitted to [facility] on 12/14/14 with bile leak and HIDA with ERCP attempted without success. Post procedure, patient developed tachycardia and significant abdominal pain with bloating. She underwent percutaneous drain placement by IR but continued to have high output therefore patient was transferred to [facility] on 12/16/14 for treatment. She underwent ERCP with sphincterotomy by Dr. Watt Climes on 07/16 with recommendations for follow up in 2 months for stent removal. She continues to have output via drainage and GI following for input. Patient noted to be deconditioned and CIR recommended for follow up therapy  Patient transferred to CIR on 12/20/2014 .    Patient currently requires min with basic  self-care skills secondary to decreased cardiorespiratoy endurance.  Prior to hospitalization, patient could complete ADLs with modified independent .  Patient will benefit from skilled intervention to decrease level of assist with basic self-care skills, increase independence with basic self-care skills and increase level of independence with iADL prior to discharge home with care partner.  Anticipate patient will require intermittent supervision and follow up home health.  OT - End of Session Activity Tolerance: Tolerates 30+ min activity with multiple rests Endurance Deficit: Yes Endurance Deficit Description: cardiorespiratory OT Assessment Rehab Potential (ACUTE ONLY): Good OT Patient demonstrates impairments in the following area(s): Balance;Cognition;Endurance;Safety OT Basic ADL's Functional Problem(s): Bathing;Dressing;Toileting OT Transfers Functional Problem(s): Toilet;Tub/Shower OT Plan OT Intensity: Minimum of 1-2 x/day, 45 to 90 minutes OT Frequency: 5 out of 7 days OT Duration/Estimated Length of Stay: 7-10 days OT Treatment/Interventions: Balance/vestibular training;Discharge planning;Community reintegration;DME/adaptive equipment instruction;Functional mobility training;Pain management;Patient/family education;Psychosocial support;Self Care/advanced ADL retraining;Therapeutic Exercise;Therapeutic Activities;UE/LE Strength taining/ROM;UE/LE Coordination activities OT Self Feeding Anticipated Outcome(s): Mod I OT Basic Self-Care Anticipated Outcome(s): Mod I OT Toileting Anticipated Outcome(s): Mod I OT Bathroom Transfers Anticipated Outcome(s): Mod I OT Recommendation Patient destination: Home Follow Up Recommendations: Home health OT Equipment Recommended: To be determined;Tub/shower bench   Skilled Therapeutic Intervention Pt seen for OT eval and ADL bathing and dressing session. Pt sitting up in recliner upon arrival, agreeable to tx. Pt ambulated throughout room using  RW and CGA. VCs provided to increase step stride when completing ambulation as pt has short shuffling steps when ambulating. Pt bathed seated in upright chair at sink requiring cues for initiation of task. Steadying assist provided to complete buttock hygiene. Pt  with no personal clothing at this time and hospital gown donned. Pt ambulated into bathroom and completed toileting task with steadying assist when standing and VCs for use of grab bars for safety. Pt returned to w/c and self propelled w/c to ADL apartment with emphasis on UE strengthening and functional activity tolerance. Pt educated regarding use of tub transfer bench at d/c. Pt ambulated into bathroom with RW and completed simulated shower transfer with min A and VCs for technique. Pt returned to w/c and self-propelled back to room. Pt required min A and VCs for w/c management in crowded hallway, unable to recognize and navigate around barriers independently. Pt left in room at end of session, all needs in reach.  Pt educated regarding role of OT, OT goals, POC, need for assist, DME, energy conservation, and d/c planning.   OT Evaluation Precautions/Restrictions  Precautions Precautions: Fall;Other (comment) Precaution Comments: abdominal precautions: avoid twisting and strong use of abdominals (eg - supine to long sit transfers) Restrictions Weight Bearing Restrictions: No General Chart Reviewed: Yes Pain Pain Assessment Pain Assessment: No/denies pain Home Living/Prior Functioning Home Living Available Help at Discharge: Family, Available 24 hours/day (Lives son and his girl friend, daughter, and 2 grandchildren (4 and 35)) Type of Home: House Home Access: Stairs to enter Technical brewer of Steps: 3 Entrance Stairs-Rails: None Home Layout: One level Bathroom Shower/Tub: Chiropodist: Standard Bathroom Accessibility: Yes Additional Comments: Information per patient - needs to be verified.  Per  patient, she helped with grandchildren - 45 yo and 21 yo. One sunken step to enter bathroom Vision/Perception  Vision- History Baseline Vision/History: Wears glasses Wears Glasses: At all times Patient Visual Report: No change from baseline Vision- Assessment Vision Assessment?: No apparent visual deficits Perception Comments: appears wfl Praxis Praxis-Other Comments: appears wfl  Cognition Overall Cognitive Status: History of cognitive impairments - at baseline Arousal/Alertness: Awake/alert Orientation Level: Person;Place;Situation Person: Oriented Place: Oriented Situation: Oriented Year: 2016 Month: July Day of Week: Correct Memory: Impaired Memory Impairment: Decreased recall of new information Immediate Memory Recall: Sock;Blue;Bed Memory Recall: Sock;Blue;Bed Memory Recall Sock: Without Cue Memory Recall Blue: Without Cue Memory Recall Bed: With Cue Attention: Focused;Sustained Focused Attention: Appears intact Sustained Attention: Appears intact Awareness: Appears intact Problem Solving: Impaired Problem Solving Impairment: Functional complex Executive Function: Initiating Initiating: Impaired Initiating Impairment: Functional basic Safety/Judgment: Appears intact Comments: flat affect Sensation Sensation Light Touch: Impaired Detail Stereognosis: Not tested Hot/Cold: Not tested Proprioception: Appears Intact Additional Comments: tingly in stomach Coordination Gross Motor Movements are Fluid and Coordinated: Yes Fine Motor Movements are Fluid and Coordinated: Yes Finger Nose Finger Test: slowed movement bilaterally  Heel Shin Test: wfl bilaterally Motor  Motor Motor: Within Functional Limits Motor - Skilled Clinical Observations: slowed initiation of movement globally Mobility  Bed Mobility Bed Mobility: Rolling Right;Right Sidelying to Sit Rolling Right: 5: Supervision Rolling Right Details: Verbal cues for technique Rolling Right Details (indicate  cue type and reason): logroll to reduce twisting/pulling on abdomen at surgical site Right Sidelying to Sit: 4: Min assist Right Sidelying to Sit Details: Manual facilitation for placement Transfers Transfers: Sit to Stand;Stand to Sit Sit to Stand: 4: Min assist Sit to Stand Details: Tactile cues for initiation;Verbal cues for technique;Verbal cues for sequencing Stand to Sit: 4: Min assist Stand to Sit Details (indicate cue type and reason): Manual facilitation for weight shifting  Trunk/Postural Assessment  Cervical Assessment Cervical Assessment: Within Functional Limits Thoracic Assessment Thoracic Assessment: Within Functional Limits Lumbar Assessment Lumbar Assessment: Within  Functional Limits Postural Control Postural Control: Within Functional Limits  Balance Balance Balance Assessed: Yes Static Sitting Balance Static Sitting - Balance Support: Feet supported;No upper extremity supported Static Sitting - Level of Assistance: 5: Stand by assistance Static Sitting - Comment/# of Minutes: Seated in standard chair to complete bathing task Dynamic Sitting Balance Dynamic Sitting - Balance Support: Feet supported;No upper extremity supported Dynamic Sitting - Level of Assistance: 4: Min Insurance risk surveyor Standing - Balance Support: During functional activity;Bilateral upper extremity supported Static Standing - Level of Assistance: 4: Min assist Static Standing - Comment/# of Minutes: Standing to complete bathing task Dynamic Standing Balance Dynamic Standing - Balance Support: During functional activity;Bilateral upper extremity supported Dynamic Standing - Level of Assistance: 4: Min assist Extremity/Trunk Assessment RUE Assessment RUE Assessment: Exceptions to [facility] RUE AROM (degrees) Overall AROM Right Upper Extremity: Within functional limits for tasks performed RUE Strength RUE Overall Strength: Deficits RUE Overall Strength Comments: 4/5  throughout LUE Assessment LUE Assessment: Exceptions to WFL LUE AROM (degrees) Overall AROM Left Upper Extremity: Within functional limits for tasks assessed LUE Strength LUE Overall Strength: Deficits LUE Overall Strength Comments:  4/5 thoroughout  FIM:  FIM - Eating Eating Activity: 6: More than reasonable amount of time FIM - Grooming Grooming Steps: Wash, rinse, dry hands Grooming: 4: Steadying assist  or patient completes 3 of 4 or 4 of 5 steps FIM - Bathing Bathing Steps Patient Completed: Chest;Abdomen;Right upper leg;Left lower leg (including foot);Left upper leg;Front perineal area;Right Arm;Left Arm;Buttocks;Right lower leg (including foot) Bathing: 4: Steadying assist FIM - Upper Body Dressing/Undressing Upper body dressing/undressing: 0: Wears gown/pajamas-no public clothing FIM - Lower Body Dressing/Undressing Lower body dressing/undressing: 0: Wears gown/pajamas-no public clothing FIM - Toileting Toileting steps completed by patient: Adjust clothing prior to toileting;Performs perineal hygiene;Adjust clothing after toileting Toileting Assistive Devices: Grab bar or rail for support Toileting: 4: Steadying assist FIM - Radio producer Devices: Mining engineer Transfers: 4-To toilet/BSC: Min A (steadying Pt. > 75%);4-From toilet/BSC: Min A (steadying Pt. > 75%) FIM - Tub/Shower Transfers Tub/Shower Assistive Devices: Tub transfer bench;Grab bars;Walker Tub/shower Transfers: 4-Into Tub/Shower: Min A (steadying Pt. > 75%/lift 1 leg);4-Out of Tub/Shower: Min A (steadying Pt. > 75%/lift 1 leg)   Refer to Care Plan for Long Term Goals  Recommendations for other services: None  Discharge Criteria: Patient will be discharged from OT if patient refuses treatment 3 consecutive times without medical reason, if treatment goals not met, if there is a change in medical status, if patient makes no progress towards goals or if patient is  discharged from hospital.  The above assessment, treatment plan, treatment alternatives and goals were discussed and mutually agreed upon: by patient  Ernestina Patches 12/21/2014, 12:07 PM

## 2014-12-21 NOTE — Progress Notes (Signed)
[place] PHYSICAL MEDICINE & REHABILITATION     PROGRESS NOTE    Subjective/Complaints: Had a good night. Abdominal pain a little better. Still doesn't have a good appetite  ROS: Pt denies fever, rash/itching, headache, blurred or double vision, nausea, vomiting, diarrhea, chest pain, shortness of breath, palpitations, dysuria, dizziness, neck or back pain, bleeding, anxiety, or depression   Objective: Vital Signs: Blood pressure 147/81, pulse 83, temperature 98.4 F (36.9 C), temperature source Oral, resp. rate 19, weight 64.4 kg (141 lb 15.6 oz), SpO2 95 %. No results found.  Recent Labs  12/19/14 0524 12/21/14 0533  WBC 6.4 11.5*  HGB 11.7* 13.2  HCT 34.5* 38.0  PLT 152 196    Recent Labs  12/19/14 0524 12/20/14 0528 12/21/14 0533  NA 140  --  134*  K 3.0* 3.4* 3.4*  CL 102  --  98*  GLUCOSE 109*  --  107*  BUN 7  --  7  CREATININE 0.52  --  0.48  CALCIUM 7.5*  --  7.8*   CBG (last 3)   Recent Labs  12/19/14 0415  GLUCAP 119*    Wt Readings from Last 3 Encounters:  12/21/14 64.4 kg (141 lb 15.6 oz)  12/20/14 67.384 kg (148 lb 8.9 oz)    Physical Exam:  Constitutional: She is oriented to person, place, and time. She appears well-developed and well-nourished.  HENT: oral mucosa slightly dry. edentulous Head: Normocephalic and atraumatic.  Eyes: Conjunctivae are normal. Pupils are equal, round, and reactive to light.  Neck: Normal range of motion. Neck supple.  Cardiovascular: Normal rate and regular rhythm.no murmurs  Respiratory: Effort normal and breath sounds normal.  GI: Soft. Bowel sounds are normal. She exhibits distension. There is tenderness along the right abdomen Patent drain RUQ. No drainage around tube site, other wounds healing with bruising Musculoskeletal: She exhibits no edema or tenderness.  Neurological: She is alert and oriented to person, place, and time.  Dysarthric speech (baseline speech impediment?) She is able to  follow basic commands without difficulty. UE's 4/5 deltoid, bicep, tricep, wrist, hand. LE: 3/5 hf, 4-/5 ke and 4/5 ankles. No gross sensory deficits in any limb. Has fair awareness but insight appears very basic. Seems slow to process and engage Skin: Skin is warm and dry. Abdomen noted above Psych: pt generally cooperative. Affect brighter. More interactive  Assessment/Plan: 1. Functional deficits secondary to debility after multiple medical issues which require 3+ hours per day of interdisciplinary therapy in a comprehensive inpatient rehab setting. Physiatrist is providing close team supervision and 24 hour management of active medical problems listed below. Physiatrist and rehab team continue to assess barriers to discharge/monitor patient progress toward functional and medical goals. FIM:                   Comprehension Comprehension Mode: Auditory Comprehension: 5-Follows basic conversation/direction: With extra time/assistive device  Expression Expression Mode: Verbal Expression: 4-Expresses basic 75 - 89% of the time/requires cueing 10 - 24% of the time. Needs helper to occlude trach/needs to repeat words.  Social Interaction Social Interaction: 3-Interacts appropriately 50 - 74% of the time - May be physically or verbally inappropriate.  Problem Solving Problem Solving: 3-Solves basic 50 - 74% of the time/requires cueing 25 - 49% of the time  Memory Memory: 2-Recognizes or recalls 25 - 49% of the time/requires cueing 51 - 75% of the time  Medical Problem List and Plan: 1. Functional deficits secondary to debility to complications after lap chole  2. DVT Prophylaxis/Anticoagulation: Pharmaceutical: Lovenox--continue 3. Pain Management: Will d/c dilaudid and order oxycodone for use prn. Discussed with patient.  -adjust regimen as appropriate 4. Mood: LCSW to follow for evaluation and support.  5. Neuropsych: This patient is capable of making decisions on her  own behalf. 6. Skin/Wound Care: Routine pressure relief measures. Monitor drain output tid and cleanse around drain site tid.  7. Fluids/Electrolytes/Nutrition: Monitor I/O. Labs reviewed -pt's appetite has been poor. Will have dietician follow up with patient, discussed intake with patient today  8. HTN: Poorly controlled. Will monitor every 8 hour and adjust medications as indicated. Norvasc added today. -added clonidine prn for severe elevations  9. Tachycardia: On coreg bid. Likely due to pain/deconditioning--observe--adjust meds as appropriate 10. Hypokalemia:  potassium replacement--labs reviewed 11. Diarrhea: Likely due to GB surgery as well as antibiotics. added probiotic  -encourage fluids.  12. ABLA: Recheck labs in am. No signs of blood loss on exam 13. SOB: Resolved with diuresis. Encourage IS while awake.  14. Post op biliary leak with biliary peritonitis:    -45 cc outpt over last 24 hours  -transition to augmentin ---spoke with GI this morning about plan  -may need surgical assessment before dc   -daily CBC  LOS (Days) 1 A FACE TO FACE EVALUATION WAS PERFORMED  SWARTZ,ZACHARY T 12/21/2014 8:40 AM

## 2014-12-21 NOTE — Progress Notes (Signed)
Patient information reviewed and entered into eRehab system by Killian Schwer, RN, CRRN, PPS Coordinator.  Information including medical coding and functional independence measure will be reviewed and updated through discharge.    

## 2014-12-21 NOTE — Evaluation (Signed)
Physical Therapy Assessment and Plan  Patient Details  Name: Valerie Sellers MRN: [id number] Date of Birth: 10/28/1955  PT Diagnosis: Abnormality of gait, Cognitive deficits, Coordination disorder, Difficulty walking, Dizziness and giddiness, Edema, Impaired cognition, Impaired sensation, Muscle weakness and Pain in abdomen Rehab Potential: Excellent ELOS: 7-10 days   Today's Date: 12/21/2014 PT Individual Time: 0900-1000 Treatment Session 2: 1530-1630 PT Individual Time Calculation (min): 60 min  Treatment Session 2: 60 min  Problem List:  Patient Active Problem List   Diagnosis Date Noted  . Physical deconditioning 12/20/2014  . Acute blood loss anemia 12/20/2014  . Ileus, postoperative 12/17/2014  . Essential hypertension 12/17/2014  . Hypoxia   . Bile leak, postoperative 12/16/2014    Past Medical History:  Past Medical History  Diagnosis Date  . Hypertension   . Hypercholesteremia   . Asthma   . Diverticulosis    Past Surgical History:  Past Surgical History  Procedure Laterality Date  . Cholecystectomy  12/12/2014  . Ercp N/A 12/17/2014    Procedure: ENDOSCOPIC RETROGRADE CHOLANGIOPANCREATOGRAPHY (ERCP);  Surgeon: Clarene Essex, MD;  Location: [facility] ENDOSCOPY;  Service: Endoscopy;  Laterality: N/A;    Assessment & Plan Clinical Impression: Valerie Sellers is a 59 y.o. female with history of HTN, asthma, recent Lap Chole 12/12/14 who was [facility] on 12/14/14 with bile leak and HIDA with ERCP attempted without success. Post procedure, patient developed tachycardia and significant abdominal pain with bloating. She underwent percutaneous drain placement by IR but continued to have high output therefore patient was transferred to [facility] on 12/16/14 for treatment. She underwent ERCP with sphincterotomy by Dr. Watt Climes on 07/16 with recommendations for follow up in 2 months for stent removal. She continues to have output via drainage and GI following for input. Patient noted to be  deconditioned and CIR recommended for follow up therapy.  Patient transferred to CIR on 12/20/2014 .   Patient currently requires min with mobility secondary to muscle weakness, decreased cardiorespiratoy endurance, decreased coordination and decreased motor planning, decreased initiation, decreased problem solving, decreased memory and delayed processing and decreased sitting balance, decreased standing balance and decreased balance strategies.  Prior to hospitalization, patient was independent  with mobility and lived with   in a House home.  Home access is 3Stairs to enter.  Patient will benefit from skilled PT intervention to maximize safe functional mobility, minimize fall risk and decrease caregiver burden for planned discharge home with 24 hour supervision.  Anticipate patient will benefit from follow up [facility] at discharge.  PT - End of Session Activity Tolerance: Tolerates < 10 min activity with changes in vital signs Endurance Deficit: Yes Endurance Deficit Description: cardiorespiratory PT Assessment Rehab Potential (ACUTE/IP ONLY): Excellent Barriers to Discharge: Inaccessible home environment PT Patient demonstrates impairments in the following area(s): Balance;Endurance;Edema;Motor;Pain;Safety;Sensory;Skin Integrity PT Transfers Functional Problem(s): Bed Mobility;Bed to Chair;Car;Furniture;Floor PT Locomotion Functional Problem(s): Ambulation;Wheelchair Mobility;Stairs PT Plan PT Intensity: Minimum of 1-2 x/day ,45 to 90 minutes PT Frequency: 5 out of 7 days PT Duration Estimated Length of Stay: 7-10 days PT Treatment/Interventions: Ambulation/gait training;Disease management/prevention;Pain management;Stair training;Balance/vestibular training;DME/adaptive equipment instruction;Patient/family education;Therapeutic Activities;Wheelchair propulsion/positioning;Cognitive remediation/compensation;Psychosocial support;Therapeutic Exercise;Community reintegration;Functional mobility  training;Skin care/wound management;UE/LE Strength taining/ROM;Discharge planning;Neuromuscular re-education;UE/LE Coordination activities PT Transfers Anticipated Outcome(s): mod I PT Locomotion Anticipated Outcome(s): mod I PT Recommendation Follow Up Recommendations: Home health PT;24 hour supervision/assistance Patient destination: Home Equipment Recommended: To be determined Equipment Details: Pt owns no equipment  Skilled Therapeutic Intervention Treatment Session 1: PT Evaluation: PT initiates evaluation and notes pt  presents with baseline cognitive deficits from MR, per son report to RN, as well as new decrease in functional mobility due to recent abdominal surgeries. Pt demonstrates impaired initiation with all functional mobility, slowed ambulation gait, and is overall min A for mobility.   Gait Training: PT instructs pt in ambulation with Rw on level surface x 117' req CGA for safety, and up/down 4 standard (6" height) stairs with B hand rails req CGA for safety.   Therapeutic Activity: Upon PT arrival into room, pt requests to use bathroom, and pt req Supervision/cues to roll R in flat bed without rail (avoid twisting for abdominal precautions), min A R side lie to sit transfer, CGA sit to stand, and CGA to transfer on/off toilet with RW and grab bar. Pt manages own clothing (hospital gown) and hygiene. Pt ended in recliner chair in room with call bell in reach.    Treatment Session 2: Therapeutic Activity: Pt requested to use the bathroom upon PT arrival. PT instructs pt to logroll L, then min A L side lie to sit. Pt req CGA-SBA to transfer onto/off of toilet with RW and self manages clothing and hygiene.  PT instructs pt in car transfer with use of RW req SBA for safety - simulated [employer] sedan car height.   Gait Training: PT instructs pt in ambulation with RW x 300' x 2 reps req SBA and verbal cues for a large step length.  PT tests pt's 10 Meter Walk Speed and notes pt  completes this test in 31.40 seconds with a RW and SBA at her comfortable pace.   Neuromuscular Reeducation: PT instructs pt in TUG with RW (one practice rep) x 3 reps and pt scores: 40 sec, 37 seconds, and 43 seconds; AVG: 40 sec .   PT instructs pt in 5TSTS test with arms pushing off of legs due to pt inability to stand with arms folded across chest.  Five times Sit to Stand Test (FTSS) Method: Use a straight back chair with a solid seat that is 16-18" high. Ask participant to sit on the chair with arms folded across their chest.   Instructions: "Stand up and sit down as quickly as possible 5 times, keeping your arms folded across your chest."   Measurement: Stop timing when the participant stands the 5th time.  TIME: __49 ____ (in seconds)  Times > 13.6 seconds is associated with increased disability and morbidity (Guralnik, 2000) Times > 15 seconds is predictive of recurrent falls in healthy individuals aged 10 and older (Buatois, et al., 2008) Normal performance values in community dwelling individuals aged 33 and older (Bohannon, 2006): o 60-69 years: 11.4 seconds o 70-79 years: 12.6 seconds o 80-89 years: 14.8 seconds  MCID: ? 2.3 seconds for Vestibular Disorders (Meretta, 2006)  Pt will benefit from IPR PT - Continue per PT POC.      PT Evaluation Precautions/Restrictions Precautions Precautions: Fall;Other (comment) Precaution Comments: abdominal precautions: avoid twisting and strong use of abdominals (eg - supine to long sit transfers) Restrictions Weight Bearing Restrictions: No General Chart Reviewed: Yes Family/Caregiver Present: No Vital SignsTherapy Vitals Pulse Rate: 96 BP: 135/68 mmHg Patient Position (if appropriate): Sitting Oxygen Therapy SpO2: 95 % O2 Device: Not Delivered Pain Pain Assessment Pain Assessment: 0-10 Pain Score: 8  Pain Type: Acute pain;Surgical pain Pain Location: Abdomen Pain Orientation: Right Pain Descriptors / Indicators:  Sharp Pain Onset: On-going Pain Intervention(s): Medication (See eMAR) Multiple Pain Sites: No  Treatment Session 2: Pt c/o 7/10 pain in  abdomen; PT uses rest and repositioning to decrease pt's pain as she refuses pain medication.  Home Living/Prior Functioning Home Living Available Help at Discharge: Family;Available 24 hours/day Type of Home: House Home Access: Stairs to enter [employer] of Steps: 3 Entrance Stairs-Rails: None Home Layout: One level Bathroom Shower/Tub: Chiropodist: Standard Bathroom Accessibility: Yes Additional Comments: Information per patient - needs to be verified.  Per patient, she helped with grandchildren - 34 yo and 34 yo. One sunken step to enter bathroom Vision/Perception  Perception Comments: appears wfl Praxis Praxis-Other Comments: appears wfl  Cognition Overall Cognitive Status: History of cognitive impairments - at baseline (Per son, pt has MR at baseline) Arousal/Alertness: Awake/alert Orientation Level: Oriented X4 Attention: Focused;Sustained Focused Attention: Appears intact Sustained Attention: Appears intact Memory: Impaired Memory Impairment: Decreased recall of new information (3/4 words recalled) Awareness: Appears intact Problem Solving: Impaired Problem Solving Impairment: Functional complex Executive Function: Initiating Initiating: Impaired Initiating Impairment: Functional basic Safety/Judgment: Appears intact Comments: flat affect Sensation Sensation Light Touch: Impaired Detail Stereognosis: Not tested Hot/Cold: Not tested Proprioception: Appears Intact Additional Comments: tingly in stomach Coordination Gross Motor Movements are Fluid and Coordinated: Yes Fine Motor Movements are Fluid and Coordinated: Not tested Finger Nose Finger Test: slowed movement bilaterally  Heel Shin Test: wfl bilaterally Motor  Motor Motor: Within Functional Limits Motor - Skilled Clinical Observations:  slowed initiation of movement globally  Mobility Bed Mobility Bed Mobility: Rolling Right;Right Sidelying to Sit Rolling Right: 5: Supervision Rolling Right Details: Verbal cues for technique Rolling Right Details (indicate cue type and reason): logroll to reduce twisting/pulling on abdomen at surgical site Right Sidelying to Sit: 4: Min assist Right Sidelying to Sit Details: Manual facilitation for placement Transfers Transfers: Yes Sit to Stand: 4: Min guard;From bed Stand to Sit: 4: Min guard Stand Pivot Transfers: 4: Min guard Locomotion  Ambulation Ambulation: Yes Ambulation/Gait Assistance: 4: Min guard Ambulation Distance (Feet): 117 Feet Assistive device: Rolling walker Gait Gait: Yes Gait Pattern: Impaired Gait Pattern: Decreased hip/knee flexion - right;Decreased hip/knee flexion - left;Decreased stride length;Decreased step length - right;Decreased step length - left Gait velocity: very slow Stairs / Additional Locomotion Stairs: Yes Stairs Assistance: 4: Min guard Stair Management Technique: Two rails Number of Stairs: 4 Height of Stairs: 6 Wheelchair Mobility Wheelchair Mobility: Yes Wheelchair Assistance: 1: +1 Total assist Wheelchair Assistance Details: Manual facilitation for placement Wheelchair Parts Management: Needs assistance  Trunk/Postural Assessment  Cervical Assessment Cervical Assessment: Within Functional Limits Thoracic Assessment Thoracic Assessment: Within Functional Limits Lumbar Assessment Lumbar Assessment: Within Functional Limits Postural Control Postural Control: Within Functional Limits  Balance Balance Balance Assessed: Yes Static Sitting Balance Static Sitting - Balance Support: Feet supported;No upper extremity supported Static Sitting - Level of Assistance: 5: Stand by assistance Dynamic Sitting Balance Dynamic Sitting - Balance Support: Feet supported;No upper extremity supported Dynamic Sitting - Level of Assistance: 4:  Min Insurance risk surveyor Standing - Balance Support: During functional activity;Bilateral upper extremity supported Static Standing - Level of Assistance: 4: Min assist Dynamic Standing Balance Dynamic Standing - Balance Support: During functional activity;Bilateral upper extremity supported Dynamic Standing - Level of Assistance: 4: Min assist  TUG: 40 seconds 5TSTS: (arms pushing on legs due to inability to stand with arms folded across chest): 49 seconds Extremity Assessment  RUE Assessment RUE Assessment: Exceptions to [facility] RUE AROM (degrees) Overall AROM Right Upper Extremity: Within functional limits for tasks performed RUE Strength RUE Overall Strength: Deficits RUE Overall Strength Comments:  grossly 4/5 LUE Assessment LUE Assessment: Exceptions to WFL LUE AROM (degrees) Overall AROM Left Upper Extremity: Within functional limits for tasks assessed LUE Strength LUE Overall Strength: Deficits LUE Overall Strength Comments: grossly 4/5 RLE Assessment RLE Assessment: Exceptions to [facility] RLE AROM (degrees) Overall AROM Right Lower Extremity: Within functional limits for tasks assessed RLE Strength RLE Overall Strength: Deficits RLE Overall Strength Comments: grossly 4/5 LLE Assessment LLE Assessment: Exceptions to WFL LLE AROM (degrees) Overall AROM Left Lower Extremity: Within functional limits for tasks assessed LLE Strength LLE Overall Strength: Deficits LLE Overall Strength Comments: grossly 4/5  FIM: See Flow Sheet     Refer to Care Plan for Long Term Goals  Recommendations for other services: None  Discharge Criteria: Patient will be discharged from PT if patient refuses treatment 3 consecutive times without medical reason, if treatment goals not met, if there is a change in medical status, if patient makes no progress towards goals or if patient is discharged from hospital.  The above assessment, treatment plan, treatment alternatives and goals  were discussed and mutually agreed upon: by patient  [facility] M 12/21/2014, 9:44 AM

## 2014-12-22 ENCOUNTER — Inpatient Hospital Stay (HOSPITAL_COMMUNITY): Payer: Medicaid Other | Admitting: Physical Therapy

## 2014-12-22 ENCOUNTER — Inpatient Hospital Stay (HOSPITAL_COMMUNITY): Payer: Medicaid Other | Admitting: Occupational Therapy

## 2014-12-22 ENCOUNTER — Inpatient Hospital Stay (HOSPITAL_COMMUNITY): Payer: Medicaid Other

## 2014-12-22 LAB — CBC
HCT: 35.8 % — ABNORMAL LOW (ref 36.0–46.0)
HEMOGLOBIN: 12.2 g/dL (ref 12.0–15.0)
MCH: 30.5 pg (ref 26.0–34.0)
MCHC: 34.1 g/dL (ref 30.0–36.0)
MCV: 89.5 fL (ref 78.0–100.0)
Platelets: 221 10*3/uL (ref 150–400)
RBC: 4 MIL/uL (ref 3.87–5.11)
RDW: 13.3 % (ref 11.5–15.5)
WBC: 8.4 10*3/uL (ref 4.0–10.5)

## 2014-12-22 LAB — CULTURE, BODY FLUID W GRAM STAIN -BOTTLE

## 2014-12-22 LAB — CULTURE, BODY FLUID-BOTTLE: CULTURE: NO GROWTH

## 2014-12-22 MED ORDER — BACITRACIN ZINC 500 UNIT/GM EX OINT
TOPICAL_OINTMENT | Freq: Two times a day (BID) | CUTANEOUS | Status: DC
  Administered 2014-12-23 – 2014-12-29 (×13): via TOPICAL
  Filled 2014-12-22: qty 28.35

## 2014-12-22 NOTE — Progress Notes (Signed)
Inpatient Rehabilitation Center Individual Statement of Services  Patient Name:  Valerie Sellers  Date:  12/22/2014  Welcome to the Inpatient Rehabilitation Center.  Our goal is to provide you with an individualized program based on your diagnosis and situation, designed to meet your specific needs.  With this comprehensive rehabilitation program, you will be expected to participate in at least 3 hours of rehabilitation therapies Monday-Friday, with modified therapy programming on the weekends.  Your rehabilitation program will include the following services:  Physical Therapy (PT), Occupational Therapy (OT), 24 hour per day rehabilitation nursing, Case Management (Social Worker), Rehabilitation Medicine, Nutrition Services and Pharmacy Services  Weekly team conferences will be held on Tuesdays to discuss your progress.  Your Social Worker will talk with you frequently to get your input and to update you on team discussions.  Team conferences with you and your family in attendance may also be held.  Expected length of stay:  7 to 10 days  Overall anticipated outcome:  Modified Independent with min assist for stairs  Depending on your progress and recovery, your program may change. Your Social Worker will coordinate services and will keep you informed of any changes. Your Social Worker's name and contact numbers are listed  below.  The following services may also be recommended but are not provided by the Inpatient Rehabilitation Center:   Home Health Rehabiltiation Services  Outpatient Rehabilitation Services   Arrangements will be made to provide these services after discharge if needed.  Arrangements include referral to agencies that provide these services.  Your insurance has been verified to be:  Medicaid Your primary doctor is:  Dr. Simone Curia  Pertinent information will be shared with your doctor and your insurance company.  Social Worker:  Staci Acosta, LCSW  [phone] or ([phone]  Information discussed with and copy given to patient by: Elvera Lennox, 12/22/2014, 3:11 PM

## 2014-12-22 NOTE — IPOC Note (Addendum)
Overall Plan of Care [facility]) Patient Details Name: Valerie Sellers MRN: [id number] DOB: 08-Mar-1956  Admitting Diagnosis: debility  lap chole   Hospital Problems: Principal Problem:   Physical deconditioning Active Problems:   Bile leak, postoperative   Essential hypertension   Acute blood loss anemia     Functional Problem List: Nursing Bladder, Bowel, Edema, Endurance, Medication Management, Pain, Skin Integrity, Safety  PT Balance, Endurance, Edema, Motor, Pain, Safety, Sensory, Skin Integrity  OT Balance, Cognition, Endurance, Safety  SLP    TR         Basic ADL's: OT Bathing, Dressing, Toileting     Advanced  ADL's: OT       Transfers: PT Bed Mobility, Bed to Chair, Car, Furniture, Civil Service fast streamer, Research scientist (life sciences): PT Ambulation, Psychologist, prison and probation services, Stairs     Additional Impairments: OT    SLP        TR      Anticipated Outcomes Item Anticipated Outcome  Self Feeding Mod I  Swallowing      Basic self-care  Mod I  Toileting  Mod I   Bathroom Transfers Mod I  Bowel/Bladder  manage bowel and bladder minimal assist  Transfers  mod I  Locomotion  mod I  Communication     Cognition     Pain  4 or less  Safety/Judgment  mod I   Therapy Plan: PT Intensity: Minimum of 1-2 x/day ,45 to 90 minutes PT Frequency: 5 out of 7 days PT Duration Estimated Length of Stay: 7-10 days OT Intensity: Minimum of 1-2 x/day, 45 to 90 minutes OT Frequency: 5 out of 7 days OT Duration/Estimated Length of Stay: 7-10 days         Team Interventions: Nursing Interventions Patient/Family Education, Bladder Management, Bowel Management, Disease Management/Prevention, Pain Management, Medication Management, Skin Care/Wound Management  PT interventions Ambulation/gait training, Disease management/prevention, Pain management, Stair training, Warden/ranger, DME/adaptive equipment instruction, Patient/family education, Therapeutic Activities,  Wheelchair propulsion/positioning, Cognitive remediation/compensation, Psychosocial support, Therapeutic Exercise, Community reintegration, Functional mobility training, Skin care/wound management, UE/LE Strength taining/ROM, Discharge planning, Neuromuscular re-education, UE/LE Coordination activities  OT Interventions Warden/ranger, Discharge planning, Community reintegration, DME/adaptive equipment instruction, Functional mobility training, Pain management, Patient/family education, Psychosocial support, Self Care/advanced ADL retraining, Therapeutic Exercise, Therapeutic Activities, UE/LE Strength taining/ROM, UE/LE Coordination activities  SLP Interventions    TR Interventions    SW/CM Interventions Discharge Planning, Psychosocial Support, Patient/Family Education    Team Discharge Planning: Destination: PT-Home ,OT- Home , SLP-  Projected Follow-up: PT-Home health PT, 24 hour supervision/assistance, OT-  Home health OT, SLP-  Projected Equipment Needs: PT-To be determined, OT- To be determined, Tub/shower bench, SLP-  Equipment Details: PT-Pt owns no equipment, OT-  Patient/family involved in discharge planning: PT- Patient,  OT-Patient, SLP-   MD ELOS: 7-10 days Medical Rehab Prognosis:  Excellent Assessment: The patient has been admitted for CIR therapies with the diagnosis of debility after cholectomy complications. The team will be addressing functional mobility, strength, stamina, balance, safety, adaptive techniques and equipment, self-care, bowel and bladder mgt, patient and caregiver education, pain mgt, community reintegration, wound care, nutrition. Goals have been set at mod I for mobility and self care.Ranelle Oyster, MD, FAAPMR      See Team Conference Notes for weekly updates to the plan of care

## 2014-12-22 NOTE — Progress Notes (Signed)
Subjective: No abdominal pain. Tolerating diet.  Objective: Vital signs in last 24 hours: Temp:  [98.1 F (36.7 C)-98.5 F (36.9 C)] 98.5 F (36.9 C) (07/21 0530) Pulse Rate:  [83-93] 93 (07/21 0942) Resp:  [17-18] 17 (07/21 0530) BP: (115-124)/(67-75) 124/71 mmHg (07/21 0942) SpO2:  [93 %-98 %] 93 % (07/21 0530) Weight:  [65.318 kg (144 lb)-65.635 kg (144 lb 11.2 oz)] 65.318 kg (144 lb) (07/21 0530) Weight change: 1.235 kg (2 lb 11.6 oz) Last BM Date: 12/20/14  PE: GEN :  NAD ABD:  Soft, JP drain in RUQ, non-tender RUQ drain:  < 100 cc's past couple days, significant interval improvement.  Lab Results: CBC    Component Value Date/Time   WBC 8.4 12/22/2014 0417   RBC 4.00 12/22/2014 0417   HGB 12.2 12/22/2014 0417   HCT 35.8* 12/22/2014 0417   PLT 221 12/22/2014 0417   MCV 89.5 12/22/2014 0417   MCH 30.5 12/22/2014 0417   MCHC 34.1 12/22/2014 0417   RDW 13.3 12/22/2014 0417   LYMPHSABS 0.8 12/21/2014 0533   MONOABS 0.8 12/21/2014 0533   EOSABS 0.1 12/21/2014 0533   BASOSABS 0.0 12/21/2014 0533   CMP     Component Value Date/Time   NA 134* 12/21/2014 0533   K 3.4* 12/21/2014 0533   CL 98* 12/21/2014 0533   CO2 27 12/21/2014 0533   GLUCOSE 107* 12/21/2014 0533   BUN 7 12/21/2014 0533   CREATININE 0.48 12/21/2014 0533   CALCIUM 7.8* 12/21/2014 0533   PROT 6.0* 12/21/2014 0533   ALBUMIN 2.0* 12/21/2014 0533   AST 24 12/21/2014 0533   ALT 13* 12/21/2014 0533   ALKPHOS 93 12/21/2014 0533   BILITOT 2.5* 12/21/2014 0533   GFRNONAA >60 12/21/2014 0533   GFRAA >60 12/21/2014 0533   Assessment:  1. Bile leak, post-operative, s/p ERCP with biliary sphincterotomy and bile duct stent placement. Significant downhill trend in output from JP drain.  Plan:  1.  14-day total course of antibiotics should suffice. 2.  Patient should follow-up with her primary surgeon upon discharge for removal of her JP drain. 3.  Patient will need biliary stent removal in 6-8 weeks  with Dr. Ewing Schlein, I'll alert our office. 4.  No further suggestions as inpatient; will sign-off; please call with questions; thank you for the consultation.   Valerie Sellers 12/22/2014, 1:40 PM   Pager [phone] If no answer or after 5 PM call [phone]

## 2014-12-22 NOTE — Progress Notes (Signed)
Occupational Therapy Session Note  Patient Details  Name: Valerie Sellers MRN: [id number] Date of Birth: Feb 24, 1956  Today's Date: 12/22/2014 OT Individual Time: 7829-5621 OT Individual Time Calculation (min): 70 min    Short Term Goals: Week 1:  OT Short Term Goal 1 (Week 1): LTG=STG due to estimated LOB  Skilled Therapeutic Interventions/Progress Updates:    Engaged in therapeutic activity with focus on functional mobility, dynamic standing balance with and without UE support, and overall activity tolerance.  Pt on toilet with nurse tech upon arrival, pt reports already washing up prior to this session.  Ambulated to therapy gym with RW and supervision.  Engaged in dynamic standing activity with weight shifting to alternate stepping pattern on colored dots, progressing to alternating step ups on 4# step.  Dynamic standing activity with trunk rotation and reaching outside BOS to further challenge balance.  Horse shoe activity in standing without UE support to further challenge standing balance, then had pt bend to retrieve items with use of RW.  Min/steady assist provided for standing balance without UE support and then when bending to retreive items.  Pt ambulated back to room and left seated upright in w/c with all needs in reach.  Therapy Documentation Precautions:  Precautions Precautions: Fall, Other (comment) Precaution Comments: abdominal precautions: avoid twisting and strong use of abdominals (eg - supine to long sit transfers) Restrictions Weight Bearing Restrictions: No General:   Vital Signs: Therapy Vitals Pulse Rate: 93 BP: 124/71 mmHg Pain: Pain Assessment Pain Assessment: 0-10 Pain Score: 2   See FIM for current functional status  Therapy/Group: Individual Therapy  Rosalio Loud 12/22/2014, 11:26 AM

## 2014-12-22 NOTE — Progress Notes (Signed)
[place] PHYSICAL MEDICINE & REHABILITATION     PROGRESS NOTE    Subjective/Complaints: Did well in therapies. Had a good night sleep. Tried to eat better yesterday. Feels constipated  ROS: Pt denies fever, rash/itching, headache, blurred or double vision, nausea, vomiting, diarrhea, chest pain, shortness of breath, palpitations, dysuria, dizziness, neck or back pain, bleeding, anxiety, or depression   Objective: Vital Signs: Blood pressure 115/67, pulse 92, temperature 98.5 F (36.9 C), temperature source Oral, resp. rate 17, weight 65.318 kg (144 lb), SpO2 93 %. No results found.  Recent Labs  12/21/14 0533 12/22/14 0417  WBC 11.5* 8.4  HGB 13.2 12.2  HCT 38.0 35.8*  PLT 196 221    Recent Labs  12/20/14 0528 12/21/14 0533  NA  --  134*  K 3.4* 3.4*  CL  --  98*  GLUCOSE  --  107*  BUN  --  7  CREATININE  --  0.48  CALCIUM  --  7.8*   CBG (last 3)  No results for input(s): GLUCAP in the last 72 hours.  Wt Readings from Last 3 Encounters:  12/22/14 65.318 kg (144 lb)  12/20/14 67.384 kg (148 lb 8.9 oz)    Physical Exam:  Constitutional: She is oriented to person, place, and time. She appears well-developed and well-nourished.  HENT: oral mucosa slightly dry. edentulous Head: Normocephalic and atraumatic.  Eyes: Conjunctivae are normal. Pupils are equal, round, and reactive to light.  Neck: Normal range of motion. Neck supple.  Cardiovascular: Normal rate and regular rhythm.no murmurs  Respiratory: Effort normal and breath sounds normal.  GI: Soft. Bowel sounds are normal. She exhibits distension. There is tenderness along the right abdomen Patent drain RUQ. No drainage around tube site, other entry sites healing Musculoskeletal: She exhibits no edema or tenderness.  Neurological: She is alert and oriented to person, place, and time.  Dysarthric speech (baseline speech impediment?) She is able to follow basic commands without difficulty. UE's 4/5  deltoid, bicep, tricep, wrist, hand. LE: 3/5 hf, 4-/5 ke and 4/5 ankles. No gross sensory deficits in any limb. Has fair awareness but insight appears very basic. Seems slow to process and engage Skin: Skin is warm and dry. Abdomen noted above Psych: pt generally cooperative. Affect brighter. More interactive  Assessment/Plan: 1. Functional deficits secondary to debility after multiple medical issues which require 3+ hours per day of interdisciplinary therapy in a comprehensive inpatient rehab setting. Physiatrist is providing close team supervision and 24 hour management of active medical problems listed below. Physiatrist and rehab team continue to assess barriers to discharge/monitor patient progress toward functional and medical goals. FIM: FIM - Bathing Bathing Steps Patient Completed: Chest, Abdomen, Right upper leg, Left lower leg (including foot), Left upper leg, Front perineal area, Right Arm, Left Arm, Buttocks, Right lower leg (including foot) Bathing: 4: Steadying assist  FIM - Upper Body Dressing/Undressing Upper body dressing/undressing: 0: Wears gown/pajamas-no public clothing FIM - Lower Body Dressing/Undressing Lower body dressing/undressing: 0: Wears Oceanographer  FIM - Toileting Toileting steps completed by patient: Adjust clothing prior to toileting, Adjust clothing after toileting, Performs perineal hygiene Toileting Assistive Devices: Grab bar or rail for support Toileting: 4: Steadying assist  FIM - Diplomatic Services operational officer Devices: Environmental consultant, Therapist, music Transfers: 4-To toilet/BSC: Min A (steadying Pt. > 75%), 4-From toilet/BSC: Min A (steadying Pt. > 75%)  FIM - Bed/Chair Transfer Bed/Chair Transfer Assistive Devices: Manufacturing systems engineer Transfer: 4: Supine > Sit: Min A (steadying Pt. >  75%/lift 1 leg), 4: Bed > Chair or W/C: Min A (steadying Pt. > 75%), 4: Chair or W/C > Bed: Min A (steadying Pt. > 75%)  FIM - Locomotion:  Wheelchair Locomotion: Wheelchair: 1: Total Assistance/staff pushes wheelchair (Pt<25%) FIM - Locomotion: Ambulation Locomotion: Ambulation Assistive Devices: Designer, industrial/product Ambulation/Gait Assistance: 4: Min guard Locomotion: Ambulation: 2: Travels 50 - 149 ft with minimal assistance (Pt.>75%)  Comprehension Comprehension Mode: Auditory Comprehension: 5-Follows basic conversation/direction: With no assist  Expression Expression Mode: Verbal Expression: 5-Expresses basic needs/ideas: With no assist  Social Interaction Social Interaction: 4-Interacts appropriately 75 - 89% of the time - Needs redirection for appropriate language or to initiate interaction.  Problem Solving Problem Solving: 4-Solves basic 75 - 89% of the time/requires cueing 10 - 24% of the time  Memory Memory: 4-Recognizes or recalls 75 - 89% of the time/requires cueing 10 - 24% of the time  Medical Problem List and Plan: 1. Functional deficits secondary to debility to complications after lap chole 2. DVT Prophylaxis/Anticoagulation: Pharmaceutical: Lovenox--continue 3. Pain Management: oxycodone prn. Pain fairly well controlled at present 4. Mood: LCSW to follow for evaluation and support.  5. Neuropsych: This patient is capable of making decisions on her own behalf. 6. Skin/Wound Care: Routine pressure relief measures. Monitor drain output tid and cleanse around drain site tid.  7. Fluids/Electrolytes/Nutrition: continue to encourage improved PO  8. HTN: norvasc onboard. Improved control - clonidine prn for severe elevations  9. Tachycardia: On coreg bid. Likely due to pain/deconditioning--observe--adjust meds as appropriate 10. Hypokalemia:  potassium replacement--labs reviewed 11. Diarrhea: now feels constipated  -continue probiotic  -encourage fluids.   -try suppository today 12. ABLA: hgb 12.2---no signs of blood loss---recheck next week 13. SOB: Resolved with diuresis. Encourage IS while  awake.  14. Post op biliary leak with biliary peritonitis:    -17 cc outpt over last 24 hours---dc drain soon?  -transitioned to augmentin   -may need surgical assessment before dc   -daily CBC  LOS (Days) 2 A FACE TO FACE EVALUATION WAS PERFORMED  SWARTZ,ZACHARY T 12/22/2014 8:58 AM

## 2014-12-22 NOTE — Progress Notes (Signed)
Social Work Assessment and Plan  Patient Details  Name: Valerie Sellers MRN: [id number] Date of Birth: March 22, 1956  Today's Date: 12/22/2014  Problem List:  Patient Active Problem List   Diagnosis Date Noted  . Physical deconditioning 12/20/2014  . Acute blood loss anemia 12/20/2014  . Ileus, postoperative 12/17/2014  . Essential hypertension 12/17/2014  . Hypoxia   . Bile leak, postoperative 12/16/2014   Past Medical History:  Past Medical History  Diagnosis Date  . Hypertension   . Hypercholesteremia   . Asthma   . Diverticulosis    Past Surgical History:  Past Surgical History  Procedure Laterality Date  . Cholecystectomy  12/12/2014  . Ercp N/A 12/17/2014    Procedure: ENDOSCOPIC RETROGRADE CHOLANGIOPANCREATOGRAPHY (ERCP);  Surgeon: Clarene Essex, MD;  Location: [facility] ENDOSCOPY;  Service: Endoscopy;  Laterality: N/A;   Social History:  reports that she has never smoked. She does not have any smokeless tobacco history on file. She reports that she does not drink alcohol or use illicit drugs.  Family / Support Systems Marital Status: Divorced How Long?: 13 years Patient Roles: Parent Children: Valerie Sellers - dtr - [phone]; Valerie Sellers - son - [phone] Anticipated Caregiver: Dtr, son Ability/Limitations of Caregiver: Dtr works.  Son in the home and not currently working. Pt reports someone will be with her at all times. Caregiver Availability: 24/7 Family Dynamics: close family  Social History Preferred language: English Religion:  Education: 10th grade Read: Yes (reports she can read "okay") Write: Yes Employment Status: Disabled Date Retired/Disabled/Unemployed: 2000 Public relations account executive Issues: none reported Guardian/Conservator: none reported   Abuse/Neglect Physical Abuse: Denies Verbal Abuse: Denies Sexual Abuse: Denies Exploitation of patient/patient's resources: Denies Self-Neglect: Denies  Emotional Status Pt's affect, behavior and adjustment  status: Pt reports feeling well emotionally at this time, but that she will let CSW know if anything changes.  She is pleased to be at [facility] and she and family are glad she is in this hospital. Recent Psychosocial Issues: Pt's ex-husband died in the last 6 months.  This has been hard on the whole family. Psychiatric History: Pt reports a history of depression and going to a psychiatrist, but she reports is not presently seeing a psychiatrist. Substance Abuse History: none reported  Patient / Family Perceptions, Expectations & Goals Pt/Family understanding of illness & functional limitations: Pt/family understand pt's condition and limitations. Premorbid pt/family roles/activities: Pt enjoys reading, writing (to cope with depression), [place], and spending time with her grandchildren (33 and 31). Anticipated changes in roles/activities/participation: Pt was doing a lot of the household chores, but other family members are able to do this. Pt/family expectations/goals: Pt wants to "get better."  [employer]: None Premorbid Home Care/DME Agencies: None Transportation available at discharge: family Resource referrals recommended: Neuropsychology  Discharge Planning Living Arrangements: Children, Other relatives (dtr, son/girlfriend, and 2 grandchildren) Support Systems: Children, Friends/neighbors Type of Residence: Private residence Insurance Resources: Medicaid (specify county) Theme park manager) Museum/gallery curator Resources: SSI ($733/month) Financial Screen Referred: No Living Expenses: Education officer, community Management: Patient, Family Home Management: Pt feels her family will be able to take care of the home while she is not able to do so. Patient/Family Preliminary Plans: Pt plans to return to her home with her son/girlfriend and dtr to care for her. Barriers to Discharge: Steps Social Work Anticipated Follow Up Needs: HH/OP Expected length of stay:  7 to 10 days  Clinical Impression CSW met with pt and then  spoke with her son via telephone to introduce self and role of CSW, as well as to complete assessment.  Pt was talkative with CSW and answered all of CSW's questions.  Son stated that he will be with pt at home, as he is not currently working.  Pt talked about a time when she was severely depressed, but she reports that she is not feeling depressed currently.  She is pleased with her care and is encouraged that she will be better by the time she goes home.  Pt cares about her family and they provide her with joy and support.  In particular, her grandchildren keep her going.  CSW will be available, as needed, and will assist pt/family with d/c planning.  Valerie Sellers, [name] 12/22/2014, 3:39 PM

## 2014-12-22 NOTE — Progress Notes (Signed)
Physical Therapy Session Note  Patient Details  Name: Valerie Sellers MRN: [id number] Date of Birth: Jul 09, 1955  Today's Date: 12/22/2014 PT Individual Time:  -  1530-1630 Treatment Time: 60 min     Short Term Goals: Week 1:  PT Short Term Goal 1 (Week 1): STGs = LTGs due to ELOS  Skilled Therapeutic Interventions/Progress Updates:    Gait Training: PT instructs pt in ambulation with RW x 150' req Supervision and verbal cues to stand slightly more inside the walker. PT progresses gait training to use a SPC in R hand x 75' + 150' req CGA-SBA - extremely slow, apprehensive gait, poor ability to complete 3 point gait pattern in correct sequence, but no LOB.  PT instructs pt in ascending/descending one corner flight of stairs with L rail and R SPC req SBA and verbal cues for technique.   Neuromuscular Reeducation: PT administers Berg Balance Test and pt scores: 36/56, indicating significant risk of falls.   Pt is progressing with function and able to begin gait training with a spc. Continue per PT POC.   Therapy Documentation Precautions:  Precautions Precautions: Fall, Other (comment) Precaution Comments: abdominal precautions: avoid twisting and strong use of abdominals (eg - supine to long sit transfers) Restrictions Weight Bearing Restrictions: No Pain: Pain Assessment Pain Assessment: 0-10 Pain Score: 7  Pain Type: Surgical pain Pain Location: Abdomen Pain Descriptors / Indicators: Aching Pain Onset: On-going Pain Intervention(s): Rest;Emotional support Multiple Pain Sites: No  Balance: Balance Balance Assessed: Yes Standardized Balance Assessment Standardized Balance Assessment: Berg Balance Test Berg Balance Test Sit to Stand: Able to stand without using hands and stabilize independently Standing Unsupported: Able to stand safely 2 minutes Sitting with Back Unsupported but Feet Supported on Floor or Stool: Able to sit safely and securely 2 minutes Stand to Sit: Sits  independently, has uncontrolled descent Transfers: Able to transfer with verbal cueing and /or supervision Standing Unsupported with Eyes Closed: Able to stand 10 seconds safely Standing Ubsupported with Feet Together: Needs help to attain position but able to stand for 30 seconds with feet together From Standing, Reach Forward with Outstretched Arm: Can reach forward >12 cm safely (5") From Standing Position, Pick up Object from Floor: Able to pick up shoe, needs supervision From Standing Position, Turn to Look Behind Over each Shoulder: Looks behind one side only/other side shows less weight shift Turn 360 Degrees: Needs close supervision or verbal cueing Standing Unsupported, Alternately Place Feet on Step/Stool: Able to stand independently and complete 8 steps >20 seconds Standing Unsupported, One Foot in Front: Able to take small step independently and hold 30 seconds Standing on One Leg: Tries to lift leg/unable to hold 3 seconds but remains standing independently Total Score: 36  See FIM for current functional status  Therapy/Group: Individual Therapy  Matis Monnier M 12/22/2014, 8:56 AM

## 2014-12-22 NOTE — Progress Notes (Signed)
Occupational Therapy Session Note  Patient Details  Name: Valerie Sellers MRN: [id number] Date of Birth: [birth year]  Today's Date: 12/22/2014 OT Individual Time: 1000-1100 OT Individual Time Calculation (min): 60 min   Short Term Goals: Week 1:  OT Short Term Goal 1 (Week 1): LTG=STG due to estimated LOB  Skilled Therapeutic Interventions/Progress Updates: ADL-retraining with focus on improved endurance, functional mobility, and safety awareness.   Pt received supine in bed and reporting no clothing for planned session although stating her son would bring clothes from home during the weekend.   After setup to provide shower barriers over surgical cites, pt ambulated to bathroom with only supervision assist and bathed unassisted without incident.   Pt reports limited participation in iADL as her 61 year old son remains home and is unemployed.  Pt remained seated in w/c at end of session and requested brief rest break prior to grooming seated.   Pt left in w/c at end of session with all needs placed within reach.     Therapy Documentation Precautions:  Precautions Precautions: Fall, Other (comment) Precaution Comments: abdominal precautions: avoid twisting and strong use of abdominals (eg - supine to long sit transfers) Restrictions Weight Bearing Restrictions: No  Vital Signs: Therapy Vitals Temp: 98.4 F (36.9 C) Temp Source: Oral Pulse Rate: 89 Resp: 16 BP: 125/78 mmHg Patient Position (if appropriate): Lying Oxygen Therapy SpO2: 96 %   Pain: No/denies    See FIM for current functional status  Therapy/Group: Individual Therapy  Leahmarie Gasiorowski 12/22/2014, 3:16 PM

## 2014-12-23 ENCOUNTER — Inpatient Hospital Stay (HOSPITAL_COMMUNITY): Payer: Medicaid Other | Admitting: Physical Therapy

## 2014-12-23 ENCOUNTER — Inpatient Hospital Stay (HOSPITAL_COMMUNITY): Payer: Medicaid Other | Admitting: Occupational Therapy

## 2014-12-23 ENCOUNTER — Inpatient Hospital Stay (HOSPITAL_COMMUNITY): Payer: Medicaid Other

## 2014-12-23 NOTE — Progress Notes (Signed)
Occupational Therapy Session Note  Patient Details  Name: Valerie Sellers MRN: [id number] Date of Birth: 10/30/1955  Today's Date: 12/23/2014 OT Individual Time: 1330-1430 OT Individual Time Calculation (min): 60 min    Short Term Goals: Week 1:  OT Short Term Goal 1 (Week 1): STG=LTG due to short estimated LOS  Skilled Therapeutic Interventions/Progress Updates:    Engaged in therapeutic activity with focus on functional mobility in home environment and overall activity tolerance.  Pt asleep in bed upon arrival, but willing to participate in treatment session.  Ambulated to bathroom with [facility] and close supervision.  Pt completed toilet transfer and toileting tasks with supervision.  In ADL apt engaged in functional transfers - tub/shower with use of tub transfer bench, bed, and couch all at overall supervision with use of SPC.  Discussed sleeping arrangements to increase safety in home, as pt fearful of injuring stomach.  Simulated obtaining items from refrigerator at overall supervision level, as pt reports not cooking but retrieving items from fridge.  Pt required prolonged rest break after multiple transfers and standing in kitchen.  Ambulated back to room with [facility] and left in recliner with all needs in reach.   Therapy Documentation Precautions:  Precautions Precautions: Fall, Other (comment) Precaution Comments: abdominal precautions: avoid twisting and strong use of abdominals (eg - supine to long sit transfers) Restrictions Weight Bearing Restrictions: No General:   Vital Signs: Therapy Vitals Temp: 98.5 F (36.9 C) Temp Source: Oral Pulse Rate: 89 Resp: 18 BP: 115/68 mmHg Patient Position (if appropriate): Lying Oxygen Therapy SpO2: 96 % O2 Device: Not Delivered Pain:  Pt with no c/o pain  See FIM for current functional status  Therapy/Group: Individual Therapy  Rosalio Loud 12/23/2014, 3:12 PM

## 2014-12-23 NOTE — Progress Notes (Signed)
Occupational Therapy Session Note  Patient Details  Name: Valerie Sellers MRN: [id number] Date of Birth: Nov 01, 1955  Today's Date: 12/23/2014 OT Individual Time: 1000-1130 OT Individual Time Calculation (min): 90 min   Short Term Goals: Week 1:  OT Short Term Goal 1 (Week 1): STG=LTG due to short estimated LOS  Skilled Therapeutic Interventions/Progress Updates: ADL-retraining (60 min) and therapeutic activity (30 min) with focus on adapted bathing/dressing skills, transfers, dynamic standing balance, endurance and functional mobility using straight cane.   Pt received seated in recliner and receptive for session using cane for all mobility.   With min guard assist pt ambulates to bathroom, transfers to tub bench and showers after setup to apply water barrier over incision wounds.   Pt returns to w/c to dress at sink with setup and min guard while standing to pull up underwear.   Pt was escorted to gift shop and challenged to ambulate through shop with only her cane and OT following with w/c in tow.   Pt maintained ambulation for 10 min through gift shop and to outdoor area before requesting rest break (traveling approx 300').   Pt ambulates on brick pathway and uneven terrain with supervision and is escorted to gym in w/c to complete final activity of kicking a soccer ball while walking with her cane back to her room.   Pt reports her routine of childcare includes going outdoors and playing with her grandchildren by kicking a ball and watching them on family trampoline.   Pt returns to recliner at end of session without LOB during soccer ball kick or any complaint of fatigue.     Therapy Documentation Precautions:  Precautions Precautions: Fall, Other (comment) Precaution Comments: abdominal precautions: avoid twisting and strong use of abdominals (eg - supine to long sit transfers) Restrictions Weight Bearing Restrictions: No  Pain: Pain Assessment Pain Assessment: 0-10 Pain Score: 0-No  pain  See FIM for current functional status  Therapy/Group: Individual Therapy  Stefan Karen 12/23/2014, 12:26 PM

## 2014-12-23 NOTE — Progress Notes (Signed)
[facility] PHYSICAL MEDICINE & REHABILITATION     PROGRESS NOTE    Subjective/Complaints: Therapies going well. Had some SOB last night---responded nicely to breathing treatment. Has a nebulizer at home. Po intake fair.  ROS: Pt denies fever, rash/itching, headache, blurred or double vision, nausea, vomiting, diarrhea, chest pain, palpitations, dysuria, dizziness, neck or back pain, bleeding, anxiety, or depression   Objective: Vital Signs: Blood pressure 130/73, pulse 92, temperature 98.6 F (37 C), temperature source Oral, resp. rate 18, weight 65.318 kg (144 lb), SpO2 98 %. No results found.  Recent Labs  12/21/14 0533 12/22/14 0417  WBC 11.5* 8.4  HGB 13.2 12.2  HCT 38.0 35.8*  PLT 196 221    Recent Labs  12/21/14 0533  NA 134*  K 3.4*  CL 98*  GLUCOSE 107*  BUN 7  CREATININE 0.48  CALCIUM 7.8*   CBG (last 3)  No results for input(s): GLUCAP in the last 72 hours.  Wt Readings from Last 3 Encounters:  12/22/14 65.318 kg (144 lb)  12/20/14 67.384 kg (148 lb 8.9 oz)    Physical Exam:  Constitutional: She is oriented to person, place, and time. She appears well-developed and well-nourished.  HENT: oral mucosa slightly dry. edentulous Head: Normocephalic and atraumatic.  Eyes: Conjunctivae are normal. Pupils are equal, round, and reactive to light.  Neck: Normal range of motion. Neck supple.  Cardiovascular: Normal rate and regular rhythm.no murmurs  Respiratory: Effort normal and breath sounds normal.  GI: Soft. Bowel sounds are normal. She exhibits distension. There is tenderness along the right abdomen Patent drain RUQ. No drainage around tube site/drain with minimal fluid today Musculoskeletal: She exhibits no edema or tenderness.  Neurological: She is alert and oriented to person, place, and time.  Dysarthric speech per baseline. She is able to follow basic commands without difficulty. UE's 4/5 deltoid, bicep, tricep, wrist, hand. LE: 3/5 hf,  4-/5 ke and 4/5 ankles. No gross sensory deficits in any limb. Has fair awareness but insight appears very basic. Skin: Skin is warm and dry. Abdomen noted above Psych: pt generally cooperative. Affect appropriate. Engages well  Assessment/Plan: 1. Functional deficits secondary to debility after multiple medical issues which require 3+ hours per day of interdisciplinary therapy in a comprehensive inpatient rehab setting. Physiatrist is providing close team supervision and 24 hour management of active medical problems listed below. Physiatrist and rehab team continue to assess barriers to discharge/monitor patient progress toward functional and medical goals. FIM: FIM - Bathing Bathing Steps Patient Completed: Chest, Right Arm, Left Arm, Abdomen, Front perineal area, Buttocks, Right upper leg, Left upper leg, Right lower leg (including foot), Left lower leg (including foot) Bathing: 5: Supervision: Safety issues/verbal cues  FIM - Upper Body Dressing/Undressing Upper body dressing/undressing: 0: Wears gown/pajamas-no public clothing FIM - Lower Body Dressing/Undressing Lower body dressing/undressing: 0: Wears gown/pajamas-no public clothing  FIM - Toileting Toileting steps completed by patient: Adjust clothing prior to toileting, Performs perineal hygiene, Adjust clothing after toileting Toileting Assistive Devices: Grab bar or rail for support Toileting: 4: Steadying assist  FIM - Diplomatic Services operational officer Devices: Environmental consultant, Therapist, music Transfers: 4-To toilet/BSC: Min A (steadying Pt. > 75%), 4-From toilet/BSC: Min A (steadying Pt. > 75%)  FIM - Bed/Chair Transfer Bed/Chair Transfer Assistive Devices: Walker, Arm rests Bed/Chair Transfer: 5: Supine > Sit: Supervision (verbal cues/safety issues), 5: Bed > Chair or W/C: Supervision (verbal cues/safety issues), 5: Chair or W/C > Bed: Supervision (verbal cues/safety issues)  FIM - Locomotion:  Wheelchair Locomotion:  Wheelchair: 0: Activity did not occur FIM - Locomotion: Ambulation Locomotion: Ambulation Assistive Devices: Designer, industrial/product, Emergency planning/management officer Ambulation/Gait Assistance: 4: Min guard, 5: Supervision (guard with spc; supervision with rw) Locomotion: Ambulation: 4: Travels 150 ft or more with minimal assistance (Pt.>75%)  Comprehension Comprehension Mode: Auditory Comprehension: 5-Follows basic conversation/direction: With no assist  Expression Expression Mode: Verbal Expression: 5-Expresses basic needs/ideas: With no assist  Social Interaction Social Interaction: 4-Interacts appropriately 75 - 89% of the time - Needs redirection for appropriate language or to initiate interaction.  Problem Solving Problem Solving: 4-Solves basic 75 - 89% of the time/requires cueing 10 - 24% of the time  Memory Memory: 4-Recognizes or recalls 75 - 89% of the time/requires cueing 10 - 24% of the time  Medical Problem List and Plan: 1. Functional deficits secondary to debility to complications after lap chole 2. DVT Prophylaxis/Anticoagulation: continue lovenox 3. Pain Management: oxycodone prn. Pain fairly well controlled at present 4. Mood: LCSW to follow for evaluation and support.  5. Neuropsych: This patient is capable of making decisions on her own behalf. 6. Skin/Wound Care: Routine pressure relief measures. Monitor drain output tid and cleanse around drain site tid.  7. Fluids/Electrolytes/Nutrition: continue to encourage improved PO  8. HTN: norvasc onboard. Improved control - clonidine prn for severe elevations  9. Tachycardia: On coreg bid. Likely due to pain/deconditioning--observe--adjust meds as appropriate 10. Hypokalemia:  potassium replacement--labs reviewed 11. Diarrhea: now feels constipated  -continue probiotic  -encourage fluids.   -responded to bowel interventions yesterday 12. ABLA: hgb 12.2---no signs of blood loss---recheck on Monday 13. SOB: Resolved with  diuresis. Encourage IS while awake.  14. Post op biliary leak with biliary peritonitis:    -minimal drain output---try to arrange outpt surgical follow up for removal to coincide with discharge from here  -continue augmentin   -may need surgical assessment before dc   -daily CBC  LOS (Days) 3 A FACE TO FACE EVALUATION WAS PERFORMED  Valerie Sellers 12/23/2014 8:45 AM

## 2014-12-23 NOTE — Progress Notes (Signed)
Physical Therapy Session Note  Patient Details  Name: Valerie Sellers MRN: [id number] Date of Birth: 12/28/1955  Today's Date: 12/23/2014 PT Individual Time:  [phone] Treatment Time: 60 min     Short Term Goals: Week 1:  PT Short Term Goal 1 (Week 1): STGs = LTGs due to ELOS  Skilled Therapeutic Interventions/Progress Updates:    Therapeutic Activity: Pt received up in recliner, reports needing to use the bathroom. Pt completes toilet transfer with SPC and grab bar req Supervision for safety on/off toilet. Pt self manages underwear and hygiene.  Pt completes car transfer mod I with SPC in simulated low car.   Gait Training: PT instructs pt in ambulation with SPC x 300' req SBA and verbal cues for 2 point gait pattern - pt lists to the L, but no LOB req assist to correct.  PT instructs pt in ascending/descending corner staircase with SPC req verbal cues for technique and SBA for safety.   Neuromuscular Reeducation: PT instructs pt in high level balance exercises: side step R/L, backwards walking, heel toe walking - all with SPC req CGA-SBA prn.   Therapeutic Exercise: PT instructs pt in incentive spirometry exercise: 3 x 10 reps. Pt able to achieve 500 ml on best effort.   Pt's balance and activity tolerance are improving. Pt will need a SPC at d/c. Continue per PT POC.    Therapy Documentation Precautions:  Precautions Precautions: Fall, Other (comment) Precaution Comments: abdominal precautions: avoid twisting and strong use of abdominals (eg - supine to long sit transfers) Restrictions Weight Bearing Restrictions: No Pain: Pain Assessment Pain Assessment: 0-10 Pain Score: 7  Pain Type: Surgical pain Pain Location: Back Pain Orientation: Right Pain Descriptors / Indicators: Aching Pain Onset: On-going Pain Intervention(s): RN made aware Multiple Pain Sites: No  See FIM for current functional status  Therapy/Group: Individual Therapy  Arian Murley M 12/23/2014,  8:55 AM

## 2014-12-24 ENCOUNTER — Inpatient Hospital Stay (HOSPITAL_COMMUNITY): Payer: Medicaid Other | Admitting: *Deleted

## 2014-12-24 ENCOUNTER — Inpatient Hospital Stay (HOSPITAL_COMMUNITY): Payer: Medicaid Other | Admitting: Occupational Therapy

## 2014-12-24 ENCOUNTER — Inpatient Hospital Stay (HOSPITAL_COMMUNITY): Payer: Medicaid Other | Admitting: Physical Therapy

## 2014-12-24 NOTE — Progress Notes (Signed)
[place] PHYSICAL MEDICINE & REHABILITATION     PROGRESS NOTE    Subjective/Complaints: Discussed progress with PT, amb with cane and SBA  ROS: Pt denies fever, rash/itching, headache, blurred or double vision, nausea, vomiting, diarrhea, chest pain, palpitations, dysuria,    Objective: Vital Signs: Blood pressure 141/76, pulse 93, temperature 98.5 F (36.9 C), temperature source Oral, resp. rate 18, weight 65.318 kg (144 lb), SpO2 95 %. No results found.  Recent Labs  12/22/14 0417  WBC 8.4  HGB 12.2  HCT 35.8*  PLT 221   No results for input(s): NA, K, CL, GLUCOSE, BUN, CREATININE, CALCIUM in the last 72 hours.  Invalid input(s): CO CBG (last 3)  No results for input(s): GLUCAP in the last 72 hours.  Wt Readings from Last 3 Encounters:  12/22/14 65.318 kg (144 lb)  12/20/14 67.384 kg (148 lb 8.9 oz)    Physical Exam:  Constitutional: She is oriented to person, place, and time. She appears well-developed and well-nourished.  HENT: oral mucosa slightly dry. edentulous Head: Normocephalic and atraumatic.  Eyes: Conjunctivae are normal. Pupils are equal, round, and reactive to light.  Neck: Normal range of motion. Neck supple.  Cardiovascular: Normal rate and regular rhythm.no murmurs  Respiratory: Effort normal and breath sounds normal.  GI: Soft. Bowel sounds are normal. She exhibits distension. There is tenderness along the right abdomen Patent drain RUQ. No drainage around tube site/drain, Drainage bag ~50% full Musculoskeletal: She exhibits no edema or tenderness.  Neurological: She is alert and oriented to person, place, and time.  Dysarthric speech per baseline. She is able to follow basic commands without difficulty. UE's 4/5 deltoid, bicep, tricep, wrist, hand. LE: 3/5 hf, 4-/5 ke and 4/5 ankles. No gross sensory deficits in any limb. Has fair awareness but insight appears very basic. Skin: Skin is warm and dry. Abdomen noted above Psych: pt  generally cooperative. Affect appropriate.   Assessment/Plan: 1. Functional deficits secondary to debility after multiple medical issues which require 3+ hours per day of interdisciplinary therapy in a comprehensive inpatient rehab setting. Physiatrist is providing close team supervision and 24 hour management of active medical problems listed below. Physiatrist and rehab team continue to assess barriers to discharge/monitor patient progress toward functional and medical goals. FIM: FIM - Bathing Bathing Steps Patient Completed: Chest, Right Arm, Left Arm, Abdomen, Front perineal area, Buttocks, Right upper leg, Left upper leg, Right lower leg (including foot), Left lower leg (including foot) Bathing: 5: Supervision: Safety issues/verbal cues  FIM - Upper Body Dressing/Undressing Upper body dressing/undressing: 0: Wears gown/pajamas-no public clothing FIM - Lower Body Dressing/Undressing Lower body dressing/undressing: 0: Wears Oceanographer  FIM - Toileting Toileting steps completed by patient: Adjust clothing prior to toileting, Performs perineal hygiene, Adjust clothing after toileting Toileting Assistive Devices: Grab bar or rail for support Toileting: 5: Supervision: Safety issues/verbal cues  FIM - Diplomatic Services operational officer Devices: Grab bars, Occupational hygienist Transfers: 5-To toilet/BSC: Supervision (verbal cues/safety issues), 5-From toilet/BSC: Supervision (verbal cues/safety issues)  FIM - Banker Devices: Cane, Arm rests Bed/Chair Transfer: 5: Bed > Chair or W/C: Supervision (verbal cues/safety issues), 5: Chair or W/C > Bed: Supervision (verbal cues/safety issues)  FIM - Locomotion: Wheelchair Locomotion: Wheelchair: 0: Activity did not occur FIM - Locomotion: Ambulation Locomotion: Ambulation Assistive Devices: Emergency planning/management officer Ambulation/Gait Assistance: 5: Supervision Locomotion: Ambulation: 5:  Travels 150 ft or more with supervision/safety issues  Comprehension Comprehension Mode: Auditory Comprehension: 5-Follows basic conversation/direction: With  no assist  Expression Expression Mode: Verbal Expression: 5-Expresses basic needs/ideas: With no assist  Social Interaction Social Interaction: 5-Interacts appropriately 90% of the time - Needs monitoring or encouragement for participation or interaction.  Problem Solving Problem Solving: 4-Solves basic 75 - 89% of the time/requires cueing 10 - 24% of the time  Memory Memory: 4-Recognizes or recalls 75 - 89% of the time/requires cueing 10 - 24% of the time  Medical Problem List and Plan: 1. Functional deficits secondary to debility to complications after lap chole, per PT making good progress 2. DVT Prophylaxis/Anticoagulation: continue lovenox, some abd bruising at injection sites 3. Pain Management: oxycodone prn. Pain fairly well controlled at present 4. Mood: LCSW to follow for evaluation and support.  5. Neuropsych: This patient is capable of making decisions on her own behalf. 6. Skin/Wound Care: Routine pressure relief measures. Monitor drain output tid and cleanse around drain site tid.  7. Fluids/Electrolytes/Nutrition: continue to encourage improved PO  8. HTN: norvasc onboard. Improved control - clonidine prn for severe elevations  9. Tachycardia: On coreg bid. Likely due to pain/deconditioning--observe--adjust meds as appropriate HR acceptable at 93 this am 10. Hypokalemia:  potassium replacement--labs reviewed 11. Diarrhea: now feels constipated  -continue probiotic  -encourage fluids.   -responded to bowel interventions yesterday 12. ABLA: hgb 12.2---no signs of blood loss---recheck on Monday 13. SOB: Resolved with diuresis. Encourage IS while awake.  14. Post op biliary leak with biliary peritonitis:    -minimal drain output---try to arrange outpt surgical follow up for removal to coincide with  discharge from here  -continue augmentin   -may need surgical assessment before dc   -daily CBC  LOS (Days) 4 A FACE TO FACE EVALUATION WAS PERFORMED  KIRSTEINS,ANDREW E 12/24/2014 9:03 AM

## 2014-12-24 NOTE — Progress Notes (Signed)
Physical Therapy Session Note  Patient Details  Name: Valerie Sellers MRN: [id number] Date of Birth: 1955/06/04  Today's Date: 12/24/2014 PT Individual Time: 1400-1445 PT Individual Time Calculation (min): 45 min    Skilled Therapeutic Interventions/Progress Updates:  Patient in room ,agrees to therapy session, no complains of pain or discomfort reported. Patient requested to go to the bathroom , with [facility] for ambulation , Supervision for toileting.  Gait Training to the gym with SBA and use of SPC as AD.  Dynamic balance training: standing on foam board and bouncing and catching a ball ,trunk rotation facilitation on uneven support base. Training in grape vine walking , tandem walking forward and backwards.   training in maneuvering around obstacles on carpeted surface and changing direction in cluttered spaces. Patient performed moving chairs around and placing them around table to imitate home care tasks.  Training in breathing techniques with incentive spirometer-able to go up to 500 only.  Patient stated she is not very happy to return home as her social situation in questionable, and is considering applying for independent living, but is concerned about her dtr.  Therapy Documentation Precautions:  Precautions Precautions: Fall, Other (comment) Precaution Comments: abdominal precautions: avoid twisting and strong use of abdominals (eg - supine to long sit transfers) Restrictions Weight Bearing Restrictions: No Vital Signs: Therapy Vitals Temp: 99.1 F (37.3 C) Temp Source: Oral Pulse Rate: 91 Resp: 18 BP: (!) 125/53 mmHg Patient Position (if appropriate): Sitting Oxygen Therapy SpO2: 96 % O2 Device: Not Delivered \\Locomotion  : Ambulation Ambulation/Gait Assistance: 5: Supervision     See FIM for current functional status  Therapy/Group: Individual Therapy  Dorna Mai 12/24/2014, 2:48 PM

## 2014-12-24 NOTE — Progress Notes (Signed)
Physical Therapy Session Note  Patient Details  Name: Valerie Sellers MRN: [id number] Date of Birth: 10/15/1955  Today's Date: 12/24/2014 PT Individual Time: 0900-1000 PT Individual Time Calculation (min): 60 min   Short Term Goals: Week 1:  PT Short Term Goal 1 (Week 1): STGs = LTGs due to ELOS  Skilled Therapeutic Interventions/Progress Updates:    Therapeutic Activity: Pt demonstrates mod I sit to/from supine transfer in flat bed without rail. Pt demonstrates mod I stand-step transfers bed to/from recliner with SPC. Pt req SBA for transfers without SPC.   Gait Training: PT instructs pt in ambulation with and without SPC all over unit (> 500' req ), demonstrating mod I with SPC in controlled environment and SBA without AD.  PT instructs pt in ascending/descending one corner flight of stairs with SPC only req SBA-min A with 2 LOB req assist to correct - pt reports socks are sticking to non-skid surface on stairs.   Neuromuscular Reeducation:  Functional Gait Assessment (FGA) Requirements: A marked 6-m (20-ft) walkway that is marked with a 30.48-cm (12-in) width.  _1_ 1. GAIT LEVEL SURFACE: 16.9 sec Instructions: Walk at your normal speed from here to the next mark (6 m[20 ft]). Grading: Loraine Leriche the highest category that applies. (3) Normal-Walks 6 m (20 ft) in less than 5.5 seconds, no assistive devices, good speed, no evidence for imbalance, normal gait pattern, deviates no more than 15.24 cm (6 in) outside of the 30.48-cm (12-in) walkway width. (2) Mild impairment-Walks 6 m (20 ft) in less than 7 seconds but greater than 5.5 seconds, uses assistive device, slower speed, mild gait deviations, or deviates 15.24-25.4 cm (6-10 in) outside of the 30.48-cm (12-in) walkway width. (1) Moderate impairment-Walks 6 m (20 ft), slow speed, abnormal gait pattern, evidence for imbalance, or deviates 25.4-38.1 cm (10-15 in) outside of the 30.48-cm (12-in) walkway width. Requires more than 7 seconds to  ambulate 6 m (20 ft). (0) Severe impairment-Cannot walk 6 m (20 ft) without assistance,severe gait deviations or imbalance, deviates greater than 38.1 cm (15 in) outside of the 30.48-cm (12-in) walkway width or reaches and touches the wall.  _2_ 2. CHANGE IN GAIT SPEED Instructions: Begin walking at your normal pace (for 1.5 m [5 ft]). When I tell you "go," walk as fast as you can (for 1.5 m [5 ft]). When I tell you "slow," walk as slowly as you can (for 1.5 m [5 ft]). Grading: Loraine Leriche the highest category that applies. (3) Normal-Able to smoothly change walking speed without loss of balance or gait deviation. Shows a significant difference in walking speeds between normal, fast, and slow speeds. Deviates no more than 15.24 cm (6 in) outside of the 30.48-cm (12-in) walkway width. (2) Mild impairment-Is able to change speed but demonstrates mild gait deviations, deviates 15.24-25.4 cm (6-10 in) outside of the 30.48-cm (12-in) walkway width, or no gait deviations but unable to achieve a significant change in velocity, or uses an assistive device. (1) Moderate impairment-Makes only minor adjustments to walking speed, or accomplishes a change in speed with significant gait deviations, deviates 25.4-38.1 cm (10-15 in) outside the 30.48-cm (12-in) walkway width, or changes speed but loses balance but is able to recover and continue walking. (0) Severe impairment-Cannot change speeds, deviates greater than 38.1 cm (15 in) outside 30.48-cm (12-in) walkway width, or loses balance and has to reach for wall or be caught.  2__ 3. GAIT WITH HORIZONTAL HEAD TURNS Instructions: Walk from here to the next mark 6 m (20 ft) away.  Begin walking at your normal pace. Keep walking straight; after 3 steps, turn your head to the right and keep walking straight while looking to the right. After 3 more steps, turn your head to the left and keep walking straight while looking left. Continue alternating looking right and left every 3  steps until you have completed 2 repetitions in each direction. Grading: Loraine Leriche the highest category that applies. (3) Normal-Performs head turns smoothly with no change in gait. Deviates no more than 15.24 cm (6 in) outside 30.48-cm (12-in) walkway width. (2) Mild impairment-Performs head turns smoothly with slight change in gait velocity (eg, minor disruption to smooth gait path), deviates 15.24-25.4 cm (6-10 in) outside 30.48-cm (12-in) walkway width, or uses an assistive device.  (1) Moderate impairment-Performs head turns with moderate change in gait velocity, slows down, deviates 25.4-38.1 cm (10-15 in) outside 30.48-cm (12-in) walkway width but recovers, can continue to walk. (0) Severe impairment-Performs task with severe disruption of gait (eg, staggers 38.1 cm [15 in] outside 30.48-cm (12-in) walkway width, loses balance, stops, or reaches for wall).  _2_ 4. GAIT WITH VERTICAL HEAD TURNS Instructions: Walk from here to the next mark (6 m [20 ft]). Begin walking at your normal pace. Keep walking straight; after 3 steps, tip your head up and keep walking straight while looking up. After 3 more steps, tip your head down, keep walking straight while looking down. Continue alternating looking up and down every 3 steps until you have completed 2 repetitions in each direction. Grading: Loraine Leriche the highest category that applies. (3) Normal-Performs head turns with no change in gait. Deviates no more than 15.24 cm (6 in) outside 30.48-cm (12-in) walkway width. (2) Mild impairment-Performs task with slight change in gait velocity (eg, minor disruption to smooth gait path), deviates 15.24-25.4 cm (6-10 in) outside 30.48-cm (12-in) walkway width or uses assistive device. (1) Moderate impairment-Performs task with moderate change in gait velocity, slows down, deviates 25.4-38.1 cm (10-15 in) outside 30.48-cm (12-in) walkway width but recovers, can continue to walk. (0) Severe impairment-Performs task with  severe disruption of gait (eg, staggers 38.1 cm [15 in] outside 30.48-cm (12-in) walkway width, loses balance, stops, reaches for wall).  _2_ 5. GAIT AND PIVOT TURN Instructions: Begin with walking at your normal pace. When I tell you, "turn and stop," turn as quickly as you can to face the opposite direction and stop. Grading: Loraine Leriche the highest category that applies. (3) Normal-Pivot turns safely within 3 seconds and stops quickly with no loss of balance. (2) Mild impairment-Pivot turns safely in _3 seconds and stops with no loss of balance, or pivot turns safely within 3 seconds and stops with mild imbalance, requires small steps to catch balance. (1) Moderate impairment-Turns slowly, requires verbal cueing, or requires several small steps to catch balance following turn and stop. (0) Severe impairment-Cannot turn safely, requires assistance to turn and stop.  _2_ 6. STEP OVER OBSTACLE Instructions: Begin walking at your normal speed. When you come to the shoe box, step over it, not around it, and keep walking. Grading: Loraine Leriche the highest category that applies. (3) Normal-Is able to step over 2 stacked shoe boxes taped together (22.86 cm [9 in] total height) without changing gait speed; no evidence of imbalance. (2) Mild impairment-Is able to step over one shoe box (11.43 cm [4.5 in] total height) without changing gait speed; no evidence of imbalance. (1) Moderate impairment-Is able to step over one shoe box (11.43 cm [4.5 in] total height) but must slow down  and adjust steps to clear box safely. May require verbal cueing. (0) Severe impairment-Cannot perform without assistance.  2__ 7. GAIT WITH NARROW BASE OF SUPPORT: 8 steps Instructions: Walk on the floor with arms folded across the chest, feet aligned heel to toe in tandem for a distance of 3.6 m [12 ft]. The number of steps taken in a straight line are counted for a maximum of 10 steps. Grading: Loraine Leriche the highest category that applies. (3)  Normal-Is able to ambulate for 10 steps heel to toe with no staggering. (2) Mild impairment-Ambulates 7-9 steps. (1) Moderate impairment-Ambulates 4-7 steps. (0) Severe impairment-Ambulates less than 4 steps heel to toe or cannot perform without assistance.  _1_ 8. GAIT WITH EYES CLOSED Instructions: Walk at your normal speed from here to the next mark (6 m [20 ft]) with your eyes closed. Grading: Loraine Leriche the highest category that applies. (3) Normal-Walks 6 m (20 ft), no assistive devices, good speed, no evidence of imbalance, normal gait pattern, deviates no more than 15.24 cm (6 in) outside 30.48-cm (12-in) walkway width. Ambulates 6 m (20 ft) in less than 7 seconds. (2) Mild impairment-Walks 6 m (20 ft), uses assistive device, slower speed, mild gait deviations, deviates 15.24-25.4 cm (6-10 in) outside 30.48-cm (12-in) walkway width. Ambulates 6 m (20 ft) in less than 9 seconds but greater than 7 seconds. (1) Moderate impairment-Walks 6 m (20 ft), slow speed, abnormal gait pattern, evidence for imbalance, deviates 25.4-38.1 cm (10-15 in) outside 30.48-cm (12-in) walkway width. Requires more than 9 seconds to ambulate 6 m (20 ft). (0) Severe impairment-Cannot walk 6 m (20 ft) without assistance, severe gait deviations or imbalance, deviates greater than 38.1 cm (15 in) outside 30.48-cm (12-in) walkway width or will not attempt task.  _2_ 9. AMBULATING BACKWARDS Instructions: Walk backwards until I tell you to stop. Grading: Loraine Leriche the highest category that applies. (3) Normal-Walks 6 m (20 ft), no assistive devices, good speed, no evidence for imbalance, normal gait pattern, deviates no more than 15.24 cm (6 in) outside 30.48-cm (12-in) walkway width. (2) Mild impairment-Walks 6 m (20 ft), uses assistive device, slower speed, mild gait deviations, deviates 15.24-25.4 cm (6-10 in) outside 30.48-cm (12-in) walkway width. (1) Moderate impairment-Walks 6 m (20 ft), slow speed, abnormal gait pattern,  evidence for imbalance, deviates 25.4-38.1 cm (10-15 in) outside 30.48-cm (12-in) walkway width. (0) Severe impairment-Cannot walk 6 m (20 ft) without assistance, severe gait deviations or imbalance, deviates greater than 38.1 cm (15 in) outside 30.48-cm (12-in) walkway width or will not attempt task.  1__ 10. STEPS Instructions: Walk up these stairs as you would at home (ie, using the rail if necessary). At the top turn around and walk down. Grading: Loraine Leriche the highest category that applies. (3) Normal-Alternating feet, no rail. (2) Mild impairment-Alternating feet, must use rail. (1) Moderate impairment-Two feet to a stair; must use rail. (0) Severe impairment-Cannot do safely.  TOTAL SCORE: __17___ /30 (MAXIMUM SCORE=30)  Scores of ? 22/30 on the FGA were found to be effective in predicting falls, Sensitivity 85%, Specificity 86% Scores of ? 20/30 on the FGA were optimal to predict older adults residing in community dwellings who would sustain unexplained falls in the next 6 months, Sensitivity 100%, Specificity 76% Alvino Chapel & Lucianne Muss, 2010; aged 91 to 30, Older Adults) MDC: 4.2 points for CVA Juel Burrow et al, 2010) MCID: 8 points for Balance and Vestibular Disorders Levander Campion and Juel Burrow, 2014)  Therapeutic Exercise: PT instructs pt in use of incentive spirometry: 3 x  10 reps. Pt able to achieve 750 ml on best attempt.  PT instructs pt ion nu-step at L4 x 8 minutes with B LEs only for muscular and cardiorespiratory endurance training. Pt instructed to keep pace > 30 steps/minute.   Pt is progressing and tolerating less restrictive AD, but continues to have balance and endurance deficits, as well as difficulty with stairs. Continue per PT POC.    Therapy Documentation Precautions:  Precautions Precautions: Fall, Other (comment) Precaution Comments: abdominal precautions: avoid twisting and strong use of abdominals (eg - supine to long sit transfers) Restrictions Weight Bearing Restrictions:  No Pain: Pain Assessment Pain Assessment: 0-10 Pain Score: 6  Pain Type: Surgical pain Pain Location: Abdomen Pain Orientation: Right;Lower Pain Descriptors / Indicators: Aching Pain Onset: On-going Pain Intervention(s): Rest Multiple Pain Sites: No   See FIM for current functional status  Therapy/Group: Individual Therapy  Ahliya Glatt M 12/24/2014, 9:08 AM

## 2014-12-24 NOTE — Progress Notes (Signed)
No output from drain. BLE with pitting edema. Rested quietly during night. Valerie Sellers

## 2014-12-24 NOTE — Progress Notes (Signed)
Occupational Therapy Session Note  Patient Details  Name: Valerie Sellers MRN: [id number] Date of Birth: 03/05/1956  Today's Date: 12/24/2014 OT Individual Time:  -   1100-1200  (60 min)  1st session                                          1300-1330  (30 min)   2nd session      Short Term Goals: Week 1:  OT Short Term Goal 1 (Week 1): STG=LTG due to short estimated LOS    Skilled Therapeutic Interventions/Progress Updates:    1.  Addressed functional mobility with SPC at Supervision level.  Ambulated to bathroom with SPC plus SBA.  SEE FIM.  Addressed dynamic balance with Tai chi movements with mod cues for technique and SBA for balance.  .  Pt reports she walks with grand daughter around her neighborhood  A lot.    Pt recalled precious days therapy activities with accuracy.  Pt. Reports she makes scrambled aggs for grand daughter.    Pt. Able to heat left overs on stove top with SBA and cue to use correct burner for the pot she had it sitting on.   Ambulated back to room and left  in recliner  with all needs in reach.   2.  Addressed balance, R/L discrimination.  Practiced standing balance with no hands in various warrior poses.  Addressed right/Left discrimination using cards.  Pt able to recongze 80 % correctly.  Completed the remaining 20 % with increased time and mirroring.  Left with all needs in reach.   Therapy Documentation Precautions:  Precautions Precautions: Fall, Other (comment) Precaution Comments: abdominal precautions: avoid twisting and strong use of abdominals (eg - supine to long sit transfers) Restrictions Weight Bearing Restrictions: No      Pain: 1st session  None.     2nd session 6/10 stomach      Exercises:  Tai Chi for balance      See FIM for current functional status  Therapy/Group: Individual Therapy  Lisa Roca 12/24/2014, 8:21 AM

## 2014-12-25 ENCOUNTER — Inpatient Hospital Stay (HOSPITAL_COMMUNITY): Payer: Medicaid Other

## 2014-12-25 NOTE — Progress Notes (Signed)
No measurable drainage from drain. 2 + pitting edema to BLE's. Valerie Sellers

## 2014-12-25 NOTE — Progress Notes (Signed)
Occupational Therapy Note  Patient Details  Name: Valerie Sellers MRN: [id number] Date of Birth: 1955/09/28  Today's Date: 12/25/2014 OT Individual Time: 1300-1400 OT Individual Time Calculation (min): 60 min   Pt denied pain Individual Therapy  Pt resting in recliner upon arrival and agreeable to therapy.  Pt amb with SPC to ADL apartment and engaged in home mgmt tasks including stripping bed and replacing linens.  Pt completed task with close supervision using bed as balance.  Pt did not exhibit any LOB during session.  Pt practiced ambulation without AD requiring close supervision.  Pt also engaged in dynamic standing activities without AD.  Pt educated on energy conservation strategies and pt verbalized understanding.  Pt amb without AD back to room and returned to recliner with all needs within reach.  Focus on activity tolerance, funcitonal amb for home mgmt tasks, dynamic standing balance, discharge planning, and safety awareness.   Lavone Neri [facility] 12/25/2014, 2:01 PM

## 2014-12-25 NOTE — Progress Notes (Signed)
[place] PHYSICAL MEDICINE & REHABILITATION     PROGRESS NOTE    Subjective/Complaints: RN notes 5ml outpt for biliary drain, prior notes from primary service indicated low outpt , RN flowsheet 30ml or less for the last 5 days No progressive abd pain , mild nauyea but good appetite , ate 100% meal this am  ROS: Pt denies fever, rash/itching, headache, blurred or double vision, nausea, vomiting, diarrhea, chest pain, palpitations, dysuria,    Objective: Vital Signs: Blood pressure 124/68, pulse 86, temperature 98.5 F (36.9 C), temperature source Oral, resp. rate 18, weight 65.318 kg (144 lb), SpO2 94 %. No results found. No results for input(s): WBC, HGB, HCT, PLT in the last 72 hours. No results for input(s): NA, K, CL, GLUCOSE, BUN, CREATININE, CALCIUM in the last 72 hours.  Invalid input(s): CO CBG (last 3)    No results for input(s): GLUCAP in the last 72 hours.  Wt Readings from Last 3 Encounters:  12/22/14 65.318 kg (144 lb)  12/20/14 67.384 kg (148 lb 8.9 oz)    Physical Exam:  Constitutional: She is oriented to person, place, and time. She appears well-developed and well-nourished.  HENT: oral mucosa slightly dry. edentulous Head: Normocephalic and atraumatic.  Eyes: Conjunctivae are normal. Pupils are equal, round, and reactive to light.  Neck: Normal range of motion. Neck supple.  Cardiovascular: Normal rate and regular rhythm.no murmurs  Respiratory: Effort normal and breath sounds normal.  GI: Soft. Bowel sounds are normal. She exhibits distension. There is tenderness along the right abdomen, mild, no guarding Patent drain RUQ. No drainage around tube site/drain, Drainage bag scant straw colored drainage, dk red blood tinged Musculoskeletal: She exhibits no edema or tenderness.  Neurological: She is alert and oriented to person, place, and time.  Dysarthric speech per baseline. She is able to follow basic commands without difficulty. UE's 4/5 deltoid,  bicep, tricep, wrist, hand. LE: 3/5 hf, 4-/5 ke and 4/5 ankles. No gross sensory deficits in any limb.  Skin: Skin is warm and dry. Abdomen noted above Psych: pt generally cooperative. Affect appropriate.   Assessment/Plan: 1. Functional deficits secondary to debility after multiple medical issues which require 3+ hours per day of interdisciplinary therapy in a comprehensive inpatient rehab setting. Physiatrist is providing close team supervision and 24 hour management of active medical problems listed below. Physiatrist and rehab team continue to assess barriers to discharge/monitor patient progress toward functional and medical goals. FIM: FIM - Bathing Bathing Steps Patient Completed: Chest, Right Arm, Left Arm, Abdomen, Front perineal area, Buttocks, Right upper leg, Left upper leg, Right lower leg (including foot), Left lower leg (including foot) Bathing: 5: Supervision: Safety issues/verbal cues  FIM - Upper Body Dressing/Undressing Upper body dressing/undressing: 0: Wears gown/pajamas-no public clothing FIM - Lower Body Dressing/Undressing Lower body dressing/undressing steps patient completed: Thread/unthread right underwear leg, Thread/unthread left underwear leg, Pull underwear up/down, Don/Doff right sock, Don/Doff left sock Lower body dressing/undressing: 4: Steadying Assist (wears mesh panties and footies)  FIM - Toileting Toileting steps completed by patient: Adjust clothing prior to toileting, Performs perineal hygiene, Adjust clothing after toileting Toileting Assistive Devices: Grab bar or rail for support Toileting: 5: Supervision: Safety issues/verbal cues  FIM - Diplomatic Services operational officer Devices: Grab bars, Occupational hygienist Transfers: 5-To toilet/BSC: Supervision (verbal cues/safety issues), 5-From toilet/BSC: Supervision (verbal cues/safety issues)  FIM - Banker Devices: Arm rests Bed/Chair Transfer: 6: Supine  > Sit: No assist, 6: Sit > Supine: No assist, 5:  Bed > Chair or W/C: Supervision (verbal cues/safety issues), 5: Chair or W/C > Bed: Supervision (verbal cues/safety issues)  FIM - Locomotion: Wheelchair Locomotion: Wheelchair: 0: Activity did not occur FIM - Locomotion: Ambulation Locomotion: Ambulation Assistive Devices: Emergency planning/management officer, Other (comment) Ambulation/Gait Assistance: 5: Supervision Locomotion: Ambulation: 5: Travels 150 ft or more with supervision/safety issues  Comprehension Comprehension Mode: Auditory Comprehension: 5-Follows basic conversation/direction: With no assist  Expression Expression Mode: Verbal Expression: 5-Expresses basic needs/ideas: With no assist  Social Interaction Social Interaction: 5-Interacts appropriately 90% of the time - Needs monitoring or encouragement for participation or interaction.  Problem Solving Problem Solving: 4-Solves basic 75 - 89% of the time/requires cueing 10 - 24% of the time  Memory Memory: 4-Recognizes or recalls 75 - 89% of the time/requires cueing 10 - 24% of the time  Medical Problem List and Plan: 1. Functional deficits secondary to debility to complications after lap chole, per PT making good progress 2. DVT Prophylaxis/Anticoagulation: continue lovenox, some abd bruising at injection sites 3. Pain Management: oxycodone prn. Pain fairly well controlled at present 4. Mood: LCSW to follow for evaluation and support.  5. Neuropsych: This patient is capable of making decisions on her own behalf. 6. Skin/Wound Care: Routine pressure relief measures. Monitor drain output tid and cleanse around drain site tid.  7. Fluids/Electrolytes/Nutrition: continue to encourage improved PO  8. HTN: norvasc onboard. Improved control - clonidine prn for severe elevations  9. Tachycardia: On coreg bid. Likely due to pain/deconditioning--observe--adjust meds as appropriate HR acceptable at 93 this am 10. Hypokalemia:  potassium  replacement--labs reviewed 11. Diarrhea: now feels constipated  -continue probiotic  -encourage fluids.   -responded to bowel interventions yesterday 12. ABLA: hgb 12.2---no signs of blood loss---recheck on Monday 13. SOB: Resolved with diuresis. Encourage IS while awake.  14. Post op biliary leak with biliary peritonitis:    -minimal drain output-, may be ready for d/c drain, surgery / IR will need to be  involved   -continue augmentin   -may need surgical assessment before dc   -last  CBC 7/21 normal  LOS (Days) 5 A FACE TO FACE EVALUATION WAS PERFORMED  Claudette Laws E 12/25/2014 9:17 AM

## 2014-12-26 ENCOUNTER — Inpatient Hospital Stay (HOSPITAL_COMMUNITY): Payer: Medicaid Other

## 2014-12-26 ENCOUNTER — Inpatient Hospital Stay (HOSPITAL_COMMUNITY): Payer: Medicaid Other | Admitting: Physical Therapy

## 2014-12-26 ENCOUNTER — Inpatient Hospital Stay (HOSPITAL_COMMUNITY): Payer: Medicaid Other | Admitting: Rehabilitation

## 2014-12-26 LAB — CBC
HCT: 34.6 % — ABNORMAL LOW (ref 36.0–46.0)
Hemoglobin: 12.1 g/dL (ref 12.0–15.0)
MCH: 31.5 pg (ref 26.0–34.0)
MCHC: 35 g/dL (ref 30.0–36.0)
MCV: 90.1 fL (ref 78.0–100.0)
Platelets: 407 10*3/uL — ABNORMAL HIGH (ref 150–400)
RBC: 3.84 MIL/uL — ABNORMAL LOW (ref 3.87–5.11)
RDW: 13.4 % (ref 11.5–15.5)
WBC: 9.9 10*3/uL (ref 4.0–10.5)

## 2014-12-26 NOTE — Progress Notes (Signed)
[facility]ICINE & REHABILITATION     PROGRESS NOTE    Subjective/Complaints: Drain output minimal. Appetite up and down. Minimal pain. Feels that she had a good weekend. ROS: Pt denies fever, rash/itching, headache, blurred or double vision, nausea, vomiting, diarrhea, chest pain, palpitations, dysuria,    Objective: Vital Signs: Blood pressure 123/60, pulse 89, temperature 99.4 F (37.4 C), temperature source Oral, resp. rate 18, weight 65.318 kg (144 lb), SpO2 96 %. No results found.  Recent Labs  12/26/14 0522  WBC 9.9  HGB 12.1  HCT 34.6*  PLT 407*   No results for input(s): NA, K, CL, GLUCOSE, BUN, CREATININE, CALCIUM in the last 72 hours.  Invalid input(s): CO CBG (last 3)    No results for input(s): GLUCAP in the last 72 hours.  Wt Readings from Last 3 Encounters:  12/22/14 65.318 kg (144 lb)  12/20/14 67.384 kg (148 lb 8.9 oz)    Physical Exam:  Constitutional: She is oriented to person, place, and time. She appears well-developed and well-nourished.  HENT: oral mucosa slightly dry. edentulous Head: Normocephalic and atraumatic.  Eyes: Conjunctivae are normal. Pupils are equal, round, and reactive to light.  Neck: Normal range of motion. Neck supple.  Cardiovascular: Normal rate and regular rhythm.no murmurs  Respiratory: Effort normal and breath sounds normal.  GI: Soft. Bowel sounds are normal. She exhibits distension. There is tenderness along the right abdomen, mild, no guarding Patent drain RUQ. No drainage around tube site/drain, drainage with small amount of blood. Musculoskeletal: She exhibits no edema or tenderness.  Neurological: She is alert and oriented to person, place, and time.  Dysarthric speech per baseline. She is able to follow basic commands without difficulty. UE's 4/5 deltoid, bicep, tricep, wrist, hand. LE: 3/5 hf, 4-/5 ke and 4/5 ankles. No gross sensory deficits in any limb.  Skin: Skin is warm and dry. Abdomen  noted above Psych: pt cooperative. Affect appropriate.   Assessment/Plan: 1. Functional deficits secondary to debility after multiple medical issues which require 3+ hours per day of interdisciplinary therapy in a comprehensive inpatient rehab setting. Physiatrist is providing close team supervision and 24 hour management of active medical problems listed below. Physiatrist and rehab team continue to assess barriers to discharge/monitor patient progress toward functional and medical goals. FIM: FIM - Bathing Bathing Steps Patient Completed: Chest, Right Arm, Left Arm, Abdomen, Front perineal area, Buttocks, Right upper leg, Left upper leg, Right lower leg (including foot), Left lower leg (including foot) Bathing: 5: Supervision: Safety issues/verbal cues  FIM - Upper Body Dressing/Undressing Upper body dressing/undressing: 0: Wears gown/pajamas-no public clothing FIM - Lower Body Dressing/Undressing Lower body dressing/undressing steps patient completed: Thread/unthread right underwear leg, Thread/unthread left underwear leg, Pull underwear up/down, Don/Doff right sock, Don/Doff left sock Lower body dressing/undressing: 4: Steadying Assist (wears mesh panties and footies)  FIM - Toileting Toileting steps completed by patient: Adjust clothing prior to toileting, Performs perineal hygiene, Adjust clothing after toileting Toileting Assistive Devices: Grab bar or rail for support Toileting: 5: Supervision: Safety issues/verbal cues  FIM - Diplomatic Services operational officer Devices: Grab bars, Occupational hygienist Transfers: 5-To toilet/BSC: Supervision (verbal cues/safety issues), 5-From toilet/BSC: Supervision (verbal cues/safety issues)  FIM - Banker Devices: Arm rests Bed/Chair Transfer: 6: Supine > Sit: No assist, 6: Sit > Supine: No assist, 5: Bed > Chair or W/C: Supervision (verbal cues/safety issues), 5: Chair or W/C > Bed: Supervision (verbal  cues/safety issues)  FIM - Locomotion: Wheelchair Locomotion:  Wheelchair: 0: Activity did not occur FIM - Locomotion: Ambulation Locomotion: Ambulation Assistive Devices: Emergency planning/management officer, Other (comment) Ambulation/Gait Assistance: 5: Supervision Locomotion: Ambulation: 5: Travels 150 ft or more with supervision/safety issues  Comprehension Comprehension Mode: Auditory Comprehension: 5-Follows basic conversation/direction: With no assist  Expression Expression Mode: Verbal Expression: 5-Expresses basic needs/ideas: With no assist  Social Interaction Social Interaction: 5-Interacts appropriately 90% of the time - Needs monitoring or encouragement for participation or interaction.  Problem Solving Problem Solving: 4-Solves basic 75 - 89% of the time/requires cueing 10 - 24% of the time  Memory Memory: 4-Recognizes or recalls 75 - 89% of the time/requires cueing 10 - 24% of the time  Medical Problem List and Plan: 1. Functional deficits secondary to debility to complications after lap chole, per PT making good progress 2. DVT Prophylaxis/Anticoagulation: continue lovenox---mild bruising noted 3. Pain Management: oxycodone prn. Pain fairly well controlled at present 4. Mood: LCSW to follow for evaluation and support.  5. Neuropsych: This patient is capable of making decisions on her own behalf. 6. Skin/Wound Care: Routine pressure relief measures. Monitor drain output tid and cleanse around drain site tid.  7. Fluids/Electrolytes/Nutrition: continue to encourage improved PO  8. HTN: norvasc onboard. Improved control - clonidine prn for severe elevations  9. Tachycardia: On coreg bid. Likely due to pain/deconditioning--observe--adjust meds as appropriate HR acceptable at 93 this am 10. Hypokalemia:  potassium replacement--labs reviewed 11. Diarrhea: now feels constipated  -continue probiotic  -encourage fluids.   -responded to bowel interventions yesterday 12. ABLA: hgb  12.2---no signs of blood loss---recheck on Monday 13. SOB: Resolved with diuresis. Encourage IS while awake.  14. Post op biliary leak with biliary peritonitis:    -minimal drain output-,will need to d/w RH surgery / IR regarding plan for removal  -removal of stent in a 6-8 weeks per Dr. Ewing Schlein  -continue augmentin   -I personally reviewed the patient's labs today---cbc normal  LOS (Days) 6 A FACE TO FACE EVALUATION WAS PERFORMED  Amaad Byers T 12/26/2014 8:28 AM

## 2014-12-26 NOTE — Progress Notes (Signed)
No measurable amount of drainage from right percutaneous drain. Dressing C,D, & I. Without complaint of pain. Pitting edema to BLE's. Valerie Sellers A
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
## 2014-12-26 NOTE — Progress Notes (Signed)
Occupational Therapy Note  Patient Details  Name: Iana Buzan MRN: [id number] Date of Birth: 09/14/1955  Today's Date: 12/26/2014 OT Individual Time: 1300-1400 OT Individual Time Calculation (min): 60 min   Pt denied pain Individual therapy  Pt engaged in functional amb with SPC for home mgmt tasks and therapeutic activities with focus on dynamic standing balance.  Pt engaged in game of Bocci requiring patient to amb with SPC and retrieving balls from floor to target activity tolerance and standing balance.  Pt also engaged in water plants and carrying water pail to plant area.  Pt continues to exhibit slow and deliberate transitional movements. Pt returned to room and remained in recliner with all needs within reach.  Lavone Neri [facility] 12/26/2014, 2:42 PM

## 2014-12-26 NOTE — Progress Notes (Signed)
Contacted Dr. Maryruth Bun regarding percutaneous drain. Currently has 4-5 cc drainage per day. He recommended leaving the drain in for now and have patient follow up in office next week.

## 2014-12-26 NOTE — Progress Notes (Signed)
Physical Therapy Session Note  Patient Details  Name: Valerie Sellers MRN: [id number] Date of Birth: 02/18/1956  Today's Date: 12/26/2014 PT Individual Time: 1100-1200 PT Individual Time Calculation (min): 60 min   Short Term Goals: Week 1:  PT Short Term Goal 1 (Week 1): STGs = LTGs due to ELOS  Skilled Therapeutic Interventions/Progress Updates:   Pt received sitting in recliner in room, agreeable to therapy session.  Pt requesting to use restroom prior to leaving room.  Ambulated with cane at S level and performed all toileting tasks at S level with use of grab bar.  Ambulated to sink to wash/dry hands.  Marissa Nestle, PA in room during session to discuss drain and removal of drain as well as calling MD to see how long they want to keep drain placed.  Pt verbalized understanding.  Ambulated to/from therapy gym with SPC at S level with min cues for posture and sequencing with use of cane.  Once in therapy gym, joined by RT in order to work on high level balance and gait activities with gait and ball toss/bounce/tap, forward gait/backwards gait while tossing or kicking ball.  Also worked on gait speed during task to decrease fall risk and increase stride length.  Tolerated well at min/guard to close S assist level.  Note pt needing increased rest breaks during task due to pain where drain placed.  Ended session with horse shoe toss while on airdex foam pad for balance challenge.  Requires cues to rotate trunk when reaching for horse shoes as she tends to remain very "stiff" during movements.  Also had pt retreive horse shoes for balance and functional BLE strengthening.  Tolerated well at min/guard to close S level x 2 reps.  Ambulated back to room with cane as above.  Left in recliner with all needs in reach.   Therapy Documentation Precautions:  Precautions Precautions: Fall, Other (comment) Precaution Comments: abdominal precautions: avoid twisting and strong use of abdominals (eg - supine to long sit  transfers) Restrictions Weight Bearing Restrictions: No   Vital Signs: Therapy Vitals BP: 125/62 mmHg Pain: Pain Assessment Pain Assessment: 0-10 Pain Score: 5  Pain Type: Surgical pain Pain Location: Flank Pain Orientation: Right Pain Descriptors / Indicators: Aching Pain Frequency: Intermittent Pain Onset: With Activity Pain Intervention(s): Medication (See eMAR)   Locomotion : Ambulation Ambulation/Gait Assistance: 5: Supervision   See FIM for current functional status  Therapy/Group: Individual Therapy  Vista Deck 12/26/2014, 12:35 PM

## 2014-12-26 NOTE — Progress Notes (Signed)
Recreational Therapy Session Note  Patient Details  Name: Sherron Mapp MRN: [id number] Date of Birth: 11-28-1955 Today's Date: 12/26/2014  Order received, chart reviewed  Leisure interests discussed.  Also discussed discharge planning specifically related to home set up and safety.  Co-treat with PT on activity tolerance, dynamic standing balance, & ambulation.  Pt required close supervision-min assist for balance activities..  Pt required frequent rest breaks due to fatigue and c/o of pain around surgical site, RN/PA aware. Avielle Imbert 12/26/2014, 3:44 PM

## 2014-12-27 ENCOUNTER — Inpatient Hospital Stay (HOSPITAL_COMMUNITY): Payer: Medicaid Other | Admitting: Physical Therapy

## 2014-12-27 ENCOUNTER — Inpatient Hospital Stay (HOSPITAL_COMMUNITY): Payer: Medicaid Other | Admitting: *Deleted

## 2014-12-27 ENCOUNTER — Inpatient Hospital Stay (HOSPITAL_COMMUNITY): Payer: Medicaid Other

## 2014-12-27 NOTE — Progress Notes (Signed)
[facility] PHYSICAL MEDICINE & REHABILITATION     PROGRESS NOTE    Subjective/Complaints: No complaints. Doing well in therapy. Appetite picking up ROS: Pt denies fever, rash/itching, headache, blurred or double vision, nausea, vomiting, diarrhea, chest pain, palpitations, dysuria,    Objective: Vital Signs: Blood pressure 112/73, pulse 96, temperature 98.9 F (37.2 C), temperature source Oral, resp. rate 18, weight 65.318 kg (144 lb), SpO2 95 %. No results found.  Recent Labs  12/26/14 0522  WBC 9.9  HGB 12.1  HCT 34.6*  PLT 407*   No results for input(s): NA, K, CL, GLUCOSE, BUN, CREATININE, CALCIUM in the last 72 hours.  Invalid input(s): CO CBG (last 3)    No results for input(s): GLUCAP in the last 72 hours.  Wt Readings from Last 3 Encounters:  12/22/14 65.318 kg (144 lb)  12/20/14 67.384 kg (148 lb 8.9 oz)    Physical Exam:  Constitutional: She is oriented to person, place, and time. She appears well-developed and well-nourished.  HENT: oral mucosa slightly dry. edentulous Head: Normocephalic and atraumatic.  Eyes: Conjunctivae are normal. Pupils are equal, round, and reactive to light.  Neck: Normal range of motion. Neck supple.  Cardiovascular: Normal rate and regular rhythm.no murmurs  Respiratory: Effort normal and breath sounds normal.  GI: Soft. Bowel sounds are normal. She exhibits distension. There is tenderness along the right abdomen, mild, no guarding Patent drain RUQ. No drainage around tube site/drain, drainage with small amount of blood. Musculoskeletal: She exhibits no edema or tenderness.  Neurological: She is alert and oriented to person, place, and time.  Dysarthric speech per baseline. She is able to follow basic commands without difficulty. UE's 4/5 deltoid, bicep, tricep, wrist, hand. LE: 3/5 hf, 4-/5 ke and 4/5 ankles. No gross sensory deficits in any limb.  Skin: Skin is warm and dry. Abdomen noted above Psych: pt cooperative.  Affect appropriate.   Assessment/Plan: 1. Functional deficits secondary to debility after multiple medical issues which require 3+ hours per day of interdisciplinary therapy in a comprehensive inpatient rehab setting. Physiatrist is providing close team supervision and 24 hour management of active medical problems listed below. Physiatrist and rehab team continue to assess barriers to discharge/monitor patient progress toward functional and medical goals. FIM: FIM - Bathing Bathing Steps Patient Completed: Chest, Right Arm, Left Arm, Abdomen, Front perineal area, Buttocks, Right upper leg, Left upper leg, Right lower leg (including foot), Left lower leg (including foot) Bathing: 5: Supervision: Safety issues/verbal cues  FIM - Upper Body Dressing/Undressing Upper body dressing/undressing: 7: Complete Independence: No helper FIM - Lower Body Dressing/Undressing Lower body dressing/undressing steps patient completed: Thread/unthread right underwear leg, Thread/unthread left underwear leg, Pull underwear up/down, Don/Doff right sock, Don/Doff left sock, Thread/unthread right pants leg, Thread/unthread left pants leg, Pull pants up/down, Fasten/unfasten pants Lower body dressing/undressing: 5: Supervision: Safety issues/verbal cues  FIM - Toileting Toileting steps completed by patient: Adjust clothing prior to toileting, Performs perineal hygiene, Adjust clothing after toileting Toileting Assistive Devices: Grab bar or rail for support Toileting: 5: Supervision: Safety issues/verbal cues  FIM - Diplomatic Services operational officer Devices: Grab bars, Occupational hygienist Transfers: 5-To toilet/BSC: Supervision (verbal cues/safety issues), 5-From toilet/BSC: Supervision (verbal cues/safety issues)  FIM - Banker Devices: Arm rests Bed/Chair Transfer: 0: Activity did not occur  FIM - Locomotion: Wheelchair Locomotion: Wheelchair: 0: Activity did not  occur FIM - Locomotion: Ambulation Locomotion: Ambulation Assistive Devices: Emergency planning/management officer Ambulation/Gait Assistance: 5: Supervision Locomotion: Ambulation: 5:  Travels 150 ft or more with supervision/safety issues  Comprehension Comprehension Mode: Auditory Comprehension: 5-Follows basic conversation/direction: With no assist  Expression Expression Mode: Verbal Expression: 5-Expresses basic needs/ideas: With no assist  Social Interaction Social Interaction: 5-Interacts appropriately 90% of the time - Needs monitoring or encouragement for participation or interaction.  Problem Solving Problem Solving: 4-Solves basic 75 - 89% of the time/requires cueing 10 - 24% of the time  Memory Memory: 4-Recognizes or recalls 75 - 89% of the time/requires cueing 10 - 24% of the time  Medical Problem List and Plan: 1. Functional deficits secondary to debility to complications after lap chole, per PT making good progress 2. DVT Prophylaxis/Anticoagulation: continue lovenox---mild bruising noted 3. Pain Management: oxycodone prn. Pain fairly well controlled at present 4. Mood: LCSW to follow for evaluation and support.  5. Neuropsych: This patient is capable of making decisions on her own behalf. 6. Skin/Wound Care: Routine pressure relief measures. Daily drain mgt 7. Fluids/Electrolytes/Nutrition: intake generally improved  8. HTN: norvasc onboard. Improved control - clonidine prn for severe elevations  9. Tachycardia: continue coreg--HR in 90's 10. Hypokalemia:  potassium replacement--labs reviewed 11. Diarrhea: now feels constipated  -continue probiotic  -encourage fluids.   -responded to bowel interventions yesterday 12. ABLA: hgb 12.1---still no signs of blood loss 13. SOB: Resolved with diuresis. Encourage IS while awake.  14. Post op biliary leak with biliary peritonitis:    -PA has spoke to surgery who will follow up patient after discharge for drain mgt  -removal of stent  in a 6-8 weeks per Dr. Ewing Schlein  -continue augmentin until discharge  LOS (Days) 7 A FACE TO FACE EVALUATION WAS PERFORMED  Valerie Sellers 12/27/2014 8:16 AM

## 2014-12-27 NOTE — Progress Notes (Signed)
Occupational Therapy Note  Patient Details  Name: Rumi Kolodziej MRN: [id number] Date of Birth: 1956-03-13  Today's Date: 12/27/2014 OT Individual Time: 1300-1330 OT Individual Time Calculation (min): 30 min   Pt denied pain Individual therapy  Pt initially engaged in donning clothes removed from dryer before amb with SPC in room to perform grooming tasks while standing at sink.  Pt transitioned to therapy gym and engaged in dynamic standing activities while standing on foam mat to challenge standing balance,  Pt required steady A while standing on matt. Pt retrieved items from floor using SPC for support.  Pt did not exhibit any unsafe behaviors during session.  Pt returned to room and bed at end of session with all needs within reach.  Focus on functional amb with SPC, dynamic standing balance, safety awareness, and discharge planning.   Lavone Neri [facility] 12/27/2014, 2:19 PM

## 2014-12-27 NOTE — Progress Notes (Signed)
Social Work Patient ID: Valerie Sellers, female   DOB: 04/25/1956, 59 y.o.   MRN: [id number]   CSW met with pt to update her on team conference discussion and then talked with son via telephone to relay the same information.  Pt will receive a single point cane and a shower chair for home use and home health therapies will be ordered.  Pt was appreciative of this.  Pt's dtr and son cannot come to get her until 6pm on day of d/c.  CSW told Heather Roberts, RN, about this and she will inform nursing staff.  CSW told son that PA may not be available that late and he expressed understanding and will ask if his sister can get here any sooner to get pt.  Pt has done well with therapists and will d/c at a modified independent level.

## 2014-12-27 NOTE — Patient Care Conference (Signed)
Inpatient RehabilitationTeam Conference and Plan of Care Update Date: 12/27/2014   Time: 2:05 PM    Patient Name: Valerie Sellers      Medical Record Number: [id number]  Date of Birth: June 19, 1955 Sex: Female         Room/Bed: 4W17C/4W17C-01 Payor Info: Payor: MEDICAID [place] / Plan: MEDICAID [place] ACCESS / Product Type: *No Product type* /    Admitting Diagnosis: debility  lap chole   Admit Date/Time:  12/20/2014  3:40 PM Admission Comments: No comment available   Primary Diagnosis:  Physical deconditioning Principal Problem: Physical deconditioning  Patient Active Problem List   Diagnosis Date Noted  . Physical deconditioning 12/20/2014  . Acute blood loss anemia 12/20/2014  . Ileus, postoperative 12/17/2014  . Essential hypertension 12/17/2014  . Hypoxia   . Bile leak, postoperative 12/16/2014    Expected Discharge Date: Expected Discharge Date: 12/29/14  Team Members Present: Physician leading conference: Dr. Faith Rogue Social Worker Present: Staci Acosta, LCSW Nurse Present: Carmie End, RN;Other (comment) Cheri Guppy, RN) PT Present: Edman Circle, PT ([address] [place], PT) OT Present: Donzetta Kohut, OT;Other (comment) Johnsie Cancel, OT) SLP Present: Feliberto Gottron, SLP Other (Discipline and Name): Ottie Glazier, RN PPS Coordinator present : Tora Duck, RN, CRRN     Current Status/Progress Goal Weekly Team Focus  Medical   cholecystectomy, sepsis, debility  improve activity tolerance, safety  balance, drain mgt, pain, nutrition   Bowel/Bladder   patient is continent of bowel and bladder  remain continent of bowel and bladder  educate regarding signs/symtoms of constipation    Swallow/Nutrition/ Hydration             ADL's   supervision overall  bathing and tub transfers-supervision; dressing, toilet trasnfers and toileting-mod I  activity tolerace, dynamic standing balance, funcitnal amb with SPC, functional transfers   Mobility   Supervision overall; on  target to be Mod I tomorrow  Mod I overall except Min A stairs-no rail  balance, endurance, D/C planning   Communication             Safety/Cognition/ Behavioral Observations            Pain   patient reports occasional pain in the right flank ranging from 5-8/10  pain less than or equalt o 4 on a scale of 0-10  assess pain q4h and prn, mediate as indicated   Skin   ecchymosis to right and left abdomen, rash to anticubital area of left arm, incision to right side with drain in place.  no new skin injury/breakdown.  treat rash per order, change dressing to right side per order, provide preventative skin care to avoid breakdown    Rehab Goals Patient on target to meet rehab goals: Yes Rehab Goals Revised: W/C goal d/c'd due to pt progressing to ambulation with a single point cane *See Care Plan and progress notes for long and short-term goals.  Barriers to Discharge: pain, stamina, wound mgt    Possible Resolutions to Barriers:  continued stamina and strength training, family ed on wound    Discharge Planning/Teaching Needs:  Pt to return to her home with her son being with her for support as needed.  Pt's goals are modified independent.     Team Discussion:  Pt is debilitated after having gall bladder removed and then complications from that surgery.  She is back to baseline cognitively and is doing well medically.  She will continue to have drain and surgeon will take this out as an outpatient. Pt  will meet modified independent goals and will need a single point cane and shower chair at home, as well as HH for PT/OT/SW.  Revisions to Treatment Plan:  None   Continued Need for Acute Rehabilitation Level of Care: The patient requires daily medical management by a physician with specialized training in physical medicine and rehabilitation for the following conditions: Daily direction of a multidisciplinary physical rehabilitation program to ensure safe treatment while eliciting the  highest outcome that is of practical value to the patient.: Yes Daily medical management of patient stability for increased activity during participation in an intensive rehabilitation regime.: Yes Daily analysis of laboratory values and/or radiology reports with any subsequent need for medication adjustment of medical intervention for : Post surgical problems;Other  Gus Littler, [place] 12/27/2014, 2:27 PM

## 2014-12-27 NOTE — Progress Notes (Signed)
Occupational Therapy Session Note  Patient Details  Name: Kimbria Camposano MRN: [id number] Date of Birth: 10-07-1955  Today's Date: 12/27/2014 OT Individual Time: 0700-0800 OT Individual Time Calculation (min): 60 min    Short Term Goals: Week 1:  OT Short Term Goal 1 (Week 1): STG=LTG due to short estimated LOS  Skilled Therapeutic Interventions/Progress Updates:    Pt engaged in BADL retraining including bathing at shower level and dressing with sit<>stand from seat.  Pt amb with SPC to bathroom and completed all bathing and dressing tasks with supervision.  Pt initiated all tasks sequentially and did not exhibit any unsafe behaviors.  Pt amb with SPC to ADL apartment and practiced tub transfers by stepping over into tub using wall as support.  Pt stated her tub did have a grab bar on side.  Recommended shower seat for use at home.  Pt requires more than a reasonable amount of time to complete all tasks.  Focus on activity tolerance, sit<>stand, standing balance, functional amb with SPC, discharge planning, and safety awareness to increased independence with BADLs.  Therapy Documentation Precautions:  Precautions Precautions: Fall, Other (comment) Precaution Comments: abdominal precautions: avoid twisting and strong use of abdominals (eg - supine to long sit transfers) Restrictions Weight Bearing Restrictions: No   Pain: Pain Assessment Pain Assessment: No/denies pain  See FIM for current functional status  Therapy/Group: Individual Therapy  Rich Brave 12/27/2014, 8:01 AM

## 2014-12-27 NOTE — Plan of Care (Signed)
Problem: RH Wheelchair Mobility Goal: LTG Patient will propel w/c in controlled environment (PT) LTG: Patient will propel wheelchair in controlled environment, # of feet with assist (PT)  Outcome: Not Applicable Date Met:  50/09/38 D/C goal; pt has progressed to ambulatory on unit with [facility]

## 2014-12-27 NOTE — Progress Notes (Signed)
Physical Therapy Session Note  Patient Details  Name: Valerie Sellers MRN: [id number] Date of Birth: 06/28/1955  Today's Date: 12/27/2014 PT Individual Time: 0901-0959 PT Individual Time Calculation (min): 58 min   Short Term Goals: Week 1:  PT Short Term Goal 1 (Week 1): STGs = LTGs due to ELOS  Skilled Therapeutic Interventions/Progress Updates:   Pt received in recliner reporting nausea.  Pt reports this was the first time she has experience nausea.  RN and PA notified of nausea and RN administered anti-nausea medication.  Also provided pt with saltine crackers.  Pt agreeable to therapy.  Performed transfer from recliner and ambulation with SPC in controlled environment x 150' with supervision.  In gym performed stair negotiation training for home entry and hallway > bathroom entrance.  Pt negotiated 8 short steps without rail but with SPC and step to sequence with supervision to simulate home entry.  Pt also reports one larger step from hallway into bathroom; negotiated down 4 larger steps x 3 reps without rails but with SPC with min A and min verbal cues for safe step to sequence; pt with one LOB with assistance needed to control descent down stairs.  Performed re-assessment of balance with TUG and BERG; see below.  Discussed improvement and falls risk currently.  Also demonstrated to pt floor > furniture transfer and indications for calling EMS.  Pt returned demonstrated floor >furniture transfer supervision.  Returned to room with [facility] and supervision; while ambulating discussed with pt possible D/C date and focus of continued therapy.  Pt reporting improvement in nausea after session.  Pt left in recliner with all items within reach.    Therapy Documentation Precautions:  Precautions Precautions: Fall, Other (comment) Precaution Comments: abdominal precautions: avoid twisting and strong use of abdominals (eg - supine to long sit transfers) Restrictions Weight Bearing Restrictions:  No Pain: Pain Assessment Pain Assessment: 0-10 Pain Score: 0-No pain Locomotion : Ambulation Ambulation/Gait Assistance: 5: Supervision  Balance: Standardized Balance Assessment Standardized Balance Assessment: Berg Balance Test Berg Balance Test Sit to Stand: Able to stand without using hands and stabilize independently Standing Unsupported: Able to stand safely 2 minutes Sitting with Back Unsupported but Feet Supported on Floor or Stool: Able to sit safely and securely 2 minutes Stand to Sit: Sits safely with minimal use of hands Transfers: Able to transfer safely, minor use of hands Standing Unsupported with Eyes Closed: Able to stand 10 seconds safely Standing Ubsupported with Feet Together: Able to place feet together independently and stand 1 minute safely From Standing, Reach Forward with Outstretched Arm: Can reach confidently >25 cm (10") From Standing Position, Pick up Object from Floor: Able to pick up shoe safely and easily From Standing Position, Turn to Look Behind Over each Shoulder: Looks behind from both sides and weight shifts well Turn 360 Degrees: Able to turn 360 degrees safely in 4 seconds or less Standing Unsupported, Alternately Place Feet on Step/Stool: Able to stand independently and safely and complete 8 steps in 20 seconds Standing Unsupported, One Foot in Front: Able to place foot tandem independently and hold 30 seconds Standing on One Leg: Able to lift leg independently and hold 5-10 seconds Total Score: 55 Timed Up and Go Test TUG: Normal TUG Normal TUG (seconds): 14  See FIM for current functional status  Therapy/Group: Individual Therapy  Edman Circle [facility] 12/27/2014, 10:19 AM

## 2014-12-27 NOTE — Progress Notes (Signed)
Occupational Therapy Session Note  Patient Details  Name: Valerie Sellers MRN: [id number] Date of Birth: Feb 14, 1956  Today's Date: 12/27/2014 OT Individual Time: 1100-1200 OT Individual Time Calculation (min): 60 min    Short Term Goals: Week 1:  OT Short Term Goal 1 (Week 1): STG=LTG due to short estimated LOS  Skilled Therapeutic Interventions/Progress Updates: Therapeutic activity with focus on discharge planning, dynamic standing balance, and improved safety awareness.   Pt received seated in recliner awaiting therapist.  Pt ambulates to ADL apartment and completes tub transfer.   Pt able to transfer stepping over tub without LOB and was re-educated on use of shower chair versus bench.    Pt modifies request for DME to only need for shower chair, eliminating need for [facility] d/t limited living space as she shares her room with her two granddaughters.   Pt then completes simple meal prep simulation of scrambled eggs, reaching in cabinets and performing all tasks at simulated level in kitchen.  No LOB noted or cognitive deficits revealed during task.   Pt reports that she does cook breakfast for her granddaughter April, making scrambled eggs in the morning.  Pt also uses microwave as needed to provide hot lunches to both grandchildren.    Pt completes meal prep and then ambulates to therapy room to participate in dynamic standing task for 10 minutes while playing connect 4 games (4 games completed) to assess ability to learn new game and to recognize patterns.   Pt wins 1 of 5 games but is unable to defeat strategy using diagonal patterns.   Pt returns to her room and requests to toilet but reports accidental BM while expelling gas.   Pt provided gown and instructed on use of patient laundry facility to wash her clothes.        Therapy Documentation Precautions:  Precautions Precautions: Fall, Other (comment) Precaution Comments: abdominal precautions: avoid twisting and strong use of abdominals (eg -  supine to long sit transfers) Restrictions Weight Bearing Restrictions: No  Pain: Pain Assessment Pain Assessment: 0-10 Pain Score: 0-No pain  See FIM for current functional status  Therapy/Group: Individual Therapy  Layli Capshaw 12/27/2014, 12:02 PM

## 2014-12-28 ENCOUNTER — Inpatient Hospital Stay (HOSPITAL_COMMUNITY): Payer: Medicaid Other

## 2014-12-28 ENCOUNTER — Inpatient Hospital Stay (HOSPITAL_COMMUNITY): Payer: Medicaid Other | Admitting: Physical Therapy

## 2014-12-28 NOTE — Discharge Summary (Signed)
Physician Discharge Summary  Patient ID: Valerie Sellers MRN: [id number] DOB/AGE: 61-Dec-1957 61 y.o.  Admit date: 12/20/2014 Discharge date: 12/29/2014  Discharge Diagnoses:  Principal Problem:   Physical deconditioning Active Problems:   Bile leak, postoperative   Essential hypertension   Acute blood loss anemia   Discharged Condition: stable.    Labs:  Basic Metabolic Panel: BMP Latest Ref Rng 12/21/2014 12/20/2014 12/19/2014  Glucose 65 - 99 mg/dL 409(W) - 119(J)  BUN 6 - 20 mg/dL 7 - 7  Creatinine 4.78 - 1.00 mg/dL 2.95 - 6.21  Sodium 308 - 145 mmol/L 134(L) - 140  Potassium 3.5 - 5.1 mmol/L 3.4(L) 3.4(L) 3.0(L)  Chloride 101 - 111 mmol/L 98(L) - 102  CO2 22 - 32 mmol/L 27 - 32  Calcium 8.9 - 10.3 mg/dL 7.8(L) - 7.5(L)     CBC:  Recent Labs Lab 12/22/14 0417 12/26/14 0522  WBC 8.4 9.9  HGB 12.2 12.1  HCT 35.8* 34.6*  MCV 89.5 90.1  PLT 221 407*    CBG: No results for input(s): GLUCAP in the last 168 hours.   Today's Vitals   12/28/14 1700 12/29/14 0538 12/29/14 0822 12/29/14 0920  BP: 102/48 125/71 130/74   Pulse:  92 94   Temp:  98 F (36.7 C)    TempSrc:  Oral    Resp:  16    Weight:      SpO2:  96%    PainSc:    0-No pain   Brief HPI:   Valerie Sellers is a 59 y.o. female with history of HTN, asthma, recent Lap Chole 12/12/14 who was admitted to [facility] on 12/14/14 with bile leak and HIDA with ERCP attempted without success. Post procedure, patient developed tachycardia and significant abdominal pain with bloating. She underwent percutaneous drain placement by IR but continued to have high output therefore patient was transferred to [facility] on 12/16/14 for treatment. She underwent ERCP with sphincterotomy by Dr. Ewing Schlein on 07/16 with recommendations for follow up in 2 months for stent removal. She continues to have output via drainage and GI following for input. Patient noted to be deconditioned and CIR recommended for follow up therapy.   Hospital Course: Valerie Sellers  was admitted to rehab 12/20/2014 for inpatient therapies to consist of PT and OT at least three hours five days a week. Past admission physiatrist, therapy team and rehab RN have worked together to provide customized collaborative inpatient rehab.  Blood pressures have been controlled and she has been afebrile during her rehab stay. Mild hypokalemia has been supplemented and should continue to improve with improvement in po intake. She is tolerating regular diet without difficulty. Follow up CBC reveals resolution of leucocytosis and H/H has been relatively stable.  Drain site is clean and dry and drainage has resolved. Dr. Maryruth Bun was contacted for input and recommended keeping drain in till follow up in office after discharge.  IV antibiotics were changed to po Augmentin per input from GI and she had completed 2 week course of antibiotic regimen by discharge.   She has made good progress during her rehab stay and is modified independent at discharge.  HHPT/OT not covered under medicaid and [facility] to provide [facility] for drain care/ monitoring.  She was discharged to home on 12/29/14.   Rehab course: During patient's stay in rehab weekly team conferences were held to monitor patient's progress, set goals and discuss barriers to discharge. At admission, patient required min assist with self care tasks and mobility. She  has had improvement in activity tolerance, balance, postural control, as well as ability to compensate for deficits. She requires supervision for bathing and tub transfers. She is able to complete dressing tasks independently. She is independent for transfers and ambulation. She is able to ambulate 300' with SPC in supervised setting and requires min assist to navigate stairs. She has been educated on HEP. Family education was done with son who will provide supervision past discharge.    Disposition:  Home   Diet: Low fat. Low cholesterol.   Special Instructions: 1. Needs CBC and  BMET rechecked in 7-10 days on post hospital follow up.     Medication List    ASK your doctor about these medications        albuterol 108 (90 BASE) MCG/ACT inhaler  Commonly known as:  PROVENTIL HFA;VENTOLIN HFA  Inhale 1 puff into the lungs every 6 (six) hours as needed for wheezing or shortness of breath.     alendronate 70 MG tablet  Commonly known as:  FOSAMAX  Take 70 mg by mouth once a week.     amoxicillin-clavulanate 875-125 MG per tablet  Commonly known as:  AUGMENTIN  Take 1 tablet by mouth 2 (two) times daily.     aspirin 81 MG chewable tablet  Chew 1 tablet (81 mg total) by mouth daily.     atorvastatin 20 MG tablet  Commonly known as:  LIPITOR  Take 20 mg by mouth daily.     carvedilol 3.125 MG tablet  Commonly known as:  COREG  Take 1 tablet (3.125 mg total) by mouth 2 (two) times daily with a meal.     lisinopril 20 MG tablet  Commonly known as:  PRINIVIL,ZESTRIL  Take 20 mg by mouth daily.     loratadine 10 MG tablet  Commonly known as:  CLARITIN  Take 10 mg by mouth daily.     montelukast 10 MG tablet  Commonly known as:  SINGULAIR  Take 10 mg by mouth daily.     omeprazole 20 MG capsule  Commonly known as:  PRILOSEC  Take 20 mg by mouth daily.     ondansetron 4 MG tablet  Commonly known as:  ZOFRAN  Take 4 mg by mouth every 6 (six) hours as needed for nausea or vomiting.     oxyCODONE 5 MG immediate release tablet  Commonly known as:  Oxy IR/ROXICODONE  Take 1 tablet (5 mg total) by mouth every 4 (four) hours as needed for moderate pain.     Vitamin D3 5000 UNITS Caps  Take 1 capsule by mouth daily.       Follow-up Information    Call Ranelle Oyster, MD.   Specialty:  Physical Medicine and Rehabilitation   Why:  As needed   Contact information:   [address], [address] [place] [address] [phone]       Follow up with [facility], DO On 01/05/2015.   Specialty:  Surgery   Why:  Your appointment is at 2:15 to help  you get in--just go there after your visit with [name] at 2 pm     Contact information:   [address] [place] [address] [phone]       Follow up with [facility], MD. Call today.   Specialty:  Gastroenterology   Why:  for follow up in 4 weeks for stent removal   Contact information:   [address] [place] [address] [phone]  Follow up with Hal Morales, NP On 01/05/2015.   Specialty:  Nurse Practitioner   Why:  @ 2 PM   Contact information:   [address] [place] [address] [phone]       Signed: Jacquelynn Cree 12/29/2014, 3:48 PM

## 2014-12-28 NOTE — Progress Notes (Signed)
Physical Therapy Discharge Summary  Patient Details  Name: Valerie Sellers MRN: [id number] Date of Birth: 1956-02-12  Today's Date: 12/28/2014 PT Individual Time: 1000-1100  PT Individual Time Calculation (min): 60 min    Patient has met 11 of 11 long term goals due to improved activity tolerance, improved balance, improved postural control, increased strength, decreased pain, ability to compensate for deficits, improved awareness and improved coordination.  Patient to discharge at an ambulatory level Modified Independent.   Patient's care partner is independent to provide the necessary cognitive assistance at discharge.  Recommendation:  Patient will benefit from a HEP to continue improving activity tolerance and progress to functional mobility without AD.  Equipment: SPC  Reasons for discharge: treatment goals met and discharge from hospital  Patient/family agrees with progress made and goals achieved: Yes  Therapeutic Intervention: Community Integration: Pt demonstrates mod I ambulation with SPC in community setting including up/down one stairwell with SPC and one rail, in hospital lobby, managing opening fire door and walking through, and managing elevator threshold.   Therapeutic Activity: Pt demonstrates mod I floor recovery transfer, Independent bed to/from recliner transfer without AD, and Supervision progressing to mod I car transfer with [facility] - on first attempt, pt req verbal cues not to use door and on second attempt, pt demonstrates immediate recall and completes a safe transfer.   Gait Training: Pt demonstrates mod I ambulation in controlled and home setting with SPC > 300' - no LOB.  Pt req min A to ascend/descent 4 stairs with SPC only in step-to pattern, simulating home situation.   Therapeutic Exercise: PT instructs pt in one seated and standing exercises for HEP to continue with improving balance, activity tolerance, and strength: LAQ x 5 second holds, standing hip  abduction, standing knee flexion, heel/toe raise with 5 second holds, mini-squats: x 10 reps on each side.   Pt is safe to d/c home with [facility] and family assist for baseline cognitive deficits.    PT Discharge Precautions/Restrictions Precautions Precautions: Fall;Other (comment) Precaution Comments: abdominal Restrictions Weight Bearing Restrictions: No Pain Pain Assessment Pain Assessment: No/denies pain Cognition Overall Cognitive Status: History of cognitive impairments - at baseline Arousal/Alertness: Awake/alert Orientation Level: Oriented X4 Attention: Sustained Focused Attention: Appears intact Sustained Attention: Appears intact Memory: Impaired Memory Impairment: Decreased recall of new information Awareness: Appears intact Problem Solving: Impaired Problem Solving Impairment: Functional complex Executive Function: Initiating Initiating: Impaired Initiating Impairment: Functional basic Safety/Judgment: Appears intact Comments: flat affect Sensation Sensation Light Touch: Appears Intact Stereognosis: Not tested Hot/Cold: Appears Intact Proprioception: Appears Intact Additional Comments: tingly in R anterior abdominal area  Coordination Gross Motor Movements are Fluid and Coordinated: Yes Fine Motor Movements are Fluid and Coordinated: Yes Finger Nose Finger Test: [facility] Motor  Motor Motor: Within Functional Limits Motor - Skilled Clinical Observations: slowed initiation of movement globally  Mobility Bed Mobility Bed Mobility: Supine to Sit;Sit to Supine Supine to Sit: 6: Modified independent (Device/Increase time) Sit to Supine: 6: Modified independent (Device/Increase time) Transfers Transfers: Yes Sit to Stand: 6: Modified independent (Device/Increase time) Stand to Sit: 6: Modified independent (Device/Increase time) Stand Pivot Transfers: 6: Modified independent (Device/Increase time) Locomotion  Ambulation Ambulation: Yes Ambulation/Gait  Assistance: 6: Modified independent (Device/Increase time) Ambulation Distance (Feet): 300 Feet Assistive device: Straight cane Gait Gait: Yes Gait Pattern: Impaired Gait Pattern: Wide base of support (poor 3 point gait technique but no LOB) Gait velocity: decreased from normal, but faster than day of eval Stairs / Additional Locomotion Stairs: Yes Stairs Assistance:  6: Modified independent (Device/Increase time) Stair Management Technique: One rail Right;Step to pattern;One rail Left;With cane Number of Stairs: 20 Height of Stairs: 6 Wheelchair Mobility Wheelchair Mobility: No  Trunk/Postural Assessment  Cervical Assessment Cervical Assessment: Within Functional Limits Thoracic Assessment Thoracic Assessment: Within Functional Limits Lumbar Assessment Lumbar Assessment: Within Functional Limits Postural Control Postural Control: Within Functional Limits  Balance Balance Balance Assessed: Yes Standardized Balance Assessment Standardized Balance Assessment: Berg Balance Test Berg Balance Test Sit to Stand: Able to stand without using hands and stabilize independently Standing Unsupported: Able to stand safely 2 minutes Sitting with Back Unsupported but Feet Supported on Floor or Stool: Able to sit safely and securely 2 minutes Stand to Sit: Sits safely with minimal use of hands Transfers: Able to transfer safely, minor use of hands Standing Unsupported with Eyes Closed: Able to stand 10 seconds safely Standing Ubsupported with Feet Together: Able to place feet together independently and stand 1 minute safely From Standing, Reach Forward with Outstretched Arm: Can reach confidently >25 cm (10") From Standing Position, Pick up Object from Floor: Able to pick up shoe safely and easily From Standing Position, Turn to Look Behind Over each Shoulder: Looks behind from both sides and weight shifts well Turn 360 Degrees: Able to turn 360 degrees safely in 4 seconds or less Standing  Unsupported, Alternately Place Feet on Step/Stool: Able to stand independently and safely and complete 8 steps in 20 seconds Standing Unsupported, One Foot in Front: Able to place foot tandem independently and hold 30 seconds Standing on One Leg: Able to lift leg independently and hold 5-10 seconds Total Score: 55 Timed Up and Go Test TUG: Normal TUG Normal TUG (seconds): 14 Static Sitting Balance Static Sitting - Level of Assistance: 7: Independent Dynamic Sitting Balance Dynamic Sitting - Balance Support: Left upper extremity supported;Right upper extremity supported;During functional activity Dynamic Sitting - Level of Assistance: 6: Modified independent (Device/Increase time) Static Standing Balance Static Standing - Balance Support: During functional activity;Bilateral upper extremity supported Static Standing - Level of Assistance: 7: Independent Dynamic Standing Balance Dynamic Standing - Balance Support: Right upper extremity supported;Left upper extremity supported;During functional activity Dynamic Standing - Level of Assistance: 6: Modified independent (Device/Increase time) Extremity Assessment  RUE Assessment RUE Assessment: Within Functional Limits RUE AROM (degrees) Overall AROM Right Upper Extremity: Within functional limits for tasks performed RUE Strength RUE Overall Strength: Within Functional Limits for tasks performed RUE Overall Strength Comments: 4/5 throughout LUE Assessment LUE Assessment: Within Functional Limits LUE AROM (degrees) Overall AROM Left Upper Extremity: Within functional limits for tasks assessed LUE Strength LUE Overall Strength: Within Functional Limits for tasks assessed LUE Overall Strength Comments:  4/5 thoroughout RLE Assessment RLE Assessment: Exceptions to [facility] RLE AROM (degrees) Overall AROM Right Lower Extremity: Within functional limits for tasks assessed RLE Strength RLE Overall Strength: Deficits RLE Overall Strength Comments:  grossly 4+/5 LLE Assessment LLE Assessment: Exceptions to WFL LLE AROM (degrees) Overall AROM Left Lower Extremity: Within functional limits for tasks assessed LLE Strength LLE Overall Strength: Deficits LLE Overall Strength Comments: grossly 4+/5  See FIM for current functional status  Steven Basso M 12/28/2014, 10:31 AM

## 2014-12-28 NOTE — Progress Notes (Addendum)
Social Work Patient ID: Valerie Sellers, female   DOB: Jan 15, 1956, 59 y.o.   MRN: [id number]   CSW realized that pt will not be eligible to receive home therapies due to Medicaid coverage only and pt does not have a therapy qualifying diagnosis.  CSW informed pt and therapists of this and therapists will give pt home exercise plans to continue her rehab on her own at home.  Pt is, however, connected with [facility]) and they will follow her closely at home to make sure she is successful at home and that she is safe in her home.  CSW spoke with Layla Barter, Transitional Care Manager, who will inform her team of pt's d/c tomorrow and her needs.

## 2014-12-28 NOTE — Progress Notes (Signed)
Pam (PA) made aware of pt temp 99.1. Instructed to educate pt on incentive spirometer use. Will continue to monitor.

## 2014-12-28 NOTE — Progress Notes (Signed)
Occupational Therapy Discharge Summary  Patient Details  Name: Valerie Sellers MRN: [id number] Date of Birth: Oct 22, 1955  Today's Date: 12/28/2014 OT Individual Time: [phone] OT Individual Time Calculation (min): 58 min    Patient has met 10 of 10 long term goals due to improved activity tolerance, improved balance, postural control, ability to compensate for deficits, improved attention, improved awareness and improved coordination.  Patient to discharge at overall Modified Independent level with supervision for bathing and tub transfer.  Patient's care partner is independent to provide the necessary cognitive assistance at discharge.    Reasons goals not met: N/A. All LTGs met  Recommendation:  HEP provided to continue improving increased balance, safety, coordination, activity tolerance, strength.   Equipment: shower seat  Reasons for discharge: treatment goals met and discharge from hospital  Patient/family agrees with progress made and goals achieved: Yes  Skilled Therapeutic Intervention Pt seen for 1:1 OT session with focus on ADL retraining, functional mobility, dynamic standing balance, cognitive remediation, and activity tolerance. Pt received supine in bed and ambulated to bathroom at Mod I level using [place]. Pt completed bathing and shower transfer with supervision. Completed dressing sit<>stand from TTB at mod I level. Pt ambulated to day room with supervision using [place] and min cues for sequencing. Engaged in [place] game in standing with pt demonstrating Mod I for dynamic standing balance and no UE support. Pt kept score during game and calculated total with min questioning cues for accuracy. At end of game pt assisted with picking up pens, reaching to floor to retrieve with supervision. Engaged in functional mobility short distances in day room without [place] to challenge balance with pt completing with supervision. Engaged in functional mobility in busy hallway to simulate community  environment with supervision using . Pt returned to room and left with all needs in reach.   OT Discharge Precautions/Restrictions  Precautions Precautions: Fall;Other (comment) Precaution Comments: abdominal Restrictions Weight Bearing Restrictions: No General   Vital Signs Therapy Vitals Temp: 99.1 F (37.3 C) Temp Source: Oral Pulse Rate: 90 Resp: 16 BP: 120/68 mmHg Patient Position (if appropriate): Lying Oxygen Therapy SpO2: 96 % O2 Device: Not Delivered Pain Pain Assessment Pain Assessment: No/denies pain ADL   Vision/Perception  Vision- History Baseline Vision/History: Wears glasses Wears Glasses: At all times Patient Visual Report: No change from baseline Vision- Assessment Vision Assessment?: Yes Eye Alignment: Impaired (comment) (R eye slightly inverted) Ocular Range of Motion: Within Functional Limits Tracking/Visual Pursuits: Able to track stimulus in all quads without difficulty;Requires cues, head turns, or add eye shifts to track Saccades: Within functional limits Convergence: Within functional limits Perception Comments: appears wfl Praxis Praxis-Other Comments: appears wfl  Cognition Overall Cognitive Status: History of cognitive impairments - at baseline Arousal/Alertness: Awake/alert Orientation Level: Oriented X4 Attention: Sustained Focused Attention: Appears intact Sustained Attention: Appears intact Memory: Impaired Memory Impairment: Decreased recall of new information Awareness: Appears intact Problem Solving: Impaired Problem Solving Impairment: Functional complex Executive Function: Initiating Initiating: Impaired Initiating Impairment: Functional basic Safety/Judgment: Appears intact Comments: flat affect Sensation Sensation Light Touch: Appears Intact Stereognosis: Not tested Hot/Cold: Appears Intact Proprioception: Appears Intact Additional Comments: tingly in R anterior abdominal area  Coordination Gross Motor  Movements are Fluid and Coordinated: Yes Fine Motor Movements are Fluid and Coordinated: Yes Finger Nose Finger Test: [facility] Motor  Motor Motor: Within Functional Limits Motor - Skilled Clinical Observations: slowed initiation of movement globally Mobility  Bed Mobility Bed Mobility: Supine to Sit;Sit to Supine Supine to Sit: 6: Modified independent (  Device/Increase time) Sit to Supine: 6: Modified independent (Device/Increase time) Transfers Transfers: Sit to Stand;Stand to Sit Sit to Stand: 6: Modified independent (Device/Increase time) Stand to Sit: 6: Modified independent (Device/Increase time)  Trunk/Postural Assessment  Cervical Assessment Cervical Assessment: Within Functional Limits Thoracic Assessment Thoracic Assessment: Within Functional Limits Lumbar Assessment Lumbar Assessment: Within Functional Limits Postural Control Postural Control: Within Functional Limits  Balance Balance Balance Assessed: Yes Dynamic Sitting Balance Dynamic Sitting - Balance Support: Feet supported;No upper extremity supported Dynamic Sitting - Level of Assistance: 7: Independent Static Standing Balance Static Standing - Balance Support: During functional activity;Bilateral upper extremity supported Static Standing - Level of Assistance: 6: Modified independent (Device/Increase time) Dynamic Standing Balance Dynamic Standing - Balance Support: During functional activity;Bilateral upper extremity supported Dynamic Standing - Level of Assistance: 6: Modified independent (Device/Increase time) Extremity/Trunk Assessment RUE Assessment RUE Assessment: Within Functional Limits RUE Strength RUE Overall Strength Comments: 4/5 throughout LUE Assessment LUE Assessment: Within Functional Limits LUE Strength LUE Overall Strength Comments:  4/5 thoroughout  See FIM for current functional status  Hailley Byers N 12/28/2014, 10:21 AM

## 2014-12-28 NOTE — Progress Notes (Signed)
Occupational Therapy Session Note  Patient Details  Name: Valerie Sellers MRN: [id number] Date of Birth: 08-08-1955  Today's Date: 12/28/2014 OT Individual Time: 1500-1600 OT Individual Time Calculation (min): 60 min    Short Term Goals: Week 1:  OT Short Term Goal 1 (Week 1): STG=LTG due to short estimated LOS  Skilled Therapeutic Interventions/Progress Updates:    Pt seen for 1:1 OT session with a focus on functional ambulation, functional endurance, cognitive remediation, dynamic standing balance, UE exercise, HEP, and discharge planning. Pt received supine in bed stating fatigue but agreeable to participate in session. Pt completed toileting at mod I level. Pt ambulated to downstairs gift shop via [facility] managing open/close of doors and crowded hospital areas, at mod I level. Pt required to recall and find 4 items and able to correctly identify 4/4 with increased time. Pt engaged in ambulation in hospital for 20 minutes before requiring rest break. Pt and therapist collaborated to determine 5 functional activities to engage in daily at home to increase activity level. Therapist then provided UE HEP with theraband with written directions and verbal demonstration. Pt then engaged in cognitive dual task in standing on foam pad while playing memory card game for 8 minutes. Pt demonstrated 12/12 accuracy of cards with min cues. Pt ambulated back to room and left supine in bed with all needs in reach.   Therapy Documentation Precautions:  Precautions Precautions: Fall, Other (comment) Precaution Comments: abdominal Restrictions Weight Bearing Restrictions: No General:   Vital Signs:   Pain: Pain Assessment Pain Assessment: No/denies pain ADL:   Exercises:   Other Treatments:    See FIM for current functional status  Therapy/Group: Individual Therapy  Alger Memos 12/28/2014, 1:17 PM

## 2014-12-28 NOTE — Progress Notes (Signed)
PHYSICAL MEDICINE & REHABILITATION     PROGRESS NOTE    Subjective/Complaints: Slept well. Had some nausea/didn'Sellers feel well yesterday mid day----feeling better today ROS: Pt denies fever, rash/itching, headache, blurred or double vision, nausea, vomiting, chest pain, palpitations, dysuria,    Objective: Vital Signs: Blood pressure 117/68, pulse 90, temperature 98.2 F (36.8 C), temperature source Oral, resp. rate 18, weight 58.4 kg (128 lb 12 oz), SpO2 95 %. No results found.  Recent Labs  12/26/14 0522  WBC 9.9  HGB 12.1  HCT 34.6*  PLT 407*   No results for input(s): NA, K, CL, GLUCOSE, BUN, CREATININE, CALCIUM in the last 72 hours.  Invalid input(s): CO CBG (last 3)    No results for input(s): GLUCAP in the last 72 hours.  Wt Readings from Last 3 Encounters:  12/28/14 58.4 kg (128 lb 12 oz)  12/20/14 67.384 kg (148 lb 8.9 oz)    Physical Exam:  Constitutional: She is oriented to person, place, and time. She appears well-developed and well-nourished.  HENT: oral mucosa slightly dry. edentulous Head: Normocephalic and atraumatic.  Eyes: Conjunctivae are normal. Pupils are equal, round, and reactive to light.  Neck: Normal range of motion. Neck supple.  Cardiovascular: Normal rate and regular rhythm.no murmurs  Respiratory: Effort normal and breath sounds normal.  GI: Soft. Bowel sounds are normal. She exhibits distension. There is tenderness along the right abdomen, mild, no guarding Patent drain RUQ. No drainage around tube site/drain, drainage with small amount of blood. Musculoskeletal: She exhibits no edema or tenderness.  Neurological: She is alert and oriented to person, place, and time.  Dysarthric speech per baseline. She is able to follow basic commands without difficulty. UE's 4/5 deltoid, bicep, tricep, wrist, hand. LE: 3/5 hf, 4-/5 ke and 4/5 ankles. No gross sensory deficits in any limb.  Skin: Skin is warm and dry. Abdomen noted  above Psych: pt cooperative. Affect appropriate.   Assessment/Plan: 1. Functional deficits secondary to debility after multiple medical issues which require 3+ hours per day of interdisciplinary therapy in a comprehensive inpatient rehab setting. Physiatrist is providing close team supervision and 24 hour management of active medical problems listed below. Physiatrist and rehab team continue to assess barriers to discharge/monitor patient progress toward functional and medical goals. FIM: FIM - Bathing Bathing Steps Patient Completed: Chest, Right Arm, Left Arm, Abdomen, Front perineal area, Buttocks, Right upper leg, Left upper leg, Right lower leg (including foot), Left lower leg (including foot) Bathing: 5: Supervision: Safety issues/verbal cues  FIM - Upper Body Dressing/Undressing Upper body dressing/undressing: 7: Complete Independence: No helper FIM - Lower Body Dressing/Undressing Lower body dressing/undressing steps patient completed: Thread/unthread right underwear leg, Thread/unthread left underwear leg, Pull underwear up/down, Don/Doff right sock, Don/Doff left sock, Thread/unthread right pants leg, Thread/unthread left pants leg, Pull pants up/down, Fasten/unfasten pants Lower body dressing/undressing: 5: Supervision: Safety issues/verbal cues  FIM - Toileting Toileting steps completed by patient: Adjust clothing prior to toileting, Performs perineal hygiene, Adjust clothing after toileting Toileting Assistive Devices: Grab bar or rail for support Toileting: 5: Supervision: Safety issues/verbal cues  FIM - Diplomatic Services operational officer Devices: Grab bars, Occupational hygienist Transfers: 5-To toilet/BSC: Supervision (verbal cues/safety issues), 5-From toilet/BSC: Supervision (verbal cues/safety issues)  FIM - Banker Devices: Arm rests Bed/Chair Transfer: 5: Bed > Chair or W/C: Supervision (verbal cues/safety issues), 5: Chair or  W/C > Bed: Supervision (verbal cues/safety issues)  FIM - Locomotion: Wheelchair Locomotion: Wheelchair: 0: Activity  did not occur FIM - Locomotion: Ambulation Locomotion: Ambulation Assistive Devices: Emergency planning/management officer Ambulation/Gait Assistance: 5: Supervision Locomotion: Ambulation: 5: Travels 150 ft or more with supervision/safety issues  Comprehension Comprehension Mode: Auditory Comprehension: 5-Follows basic conversation/direction: With no assist  Expression Expression Mode: Verbal Expression: 5-Expresses basic needs/ideas: With no assist  Social Interaction Social Interaction: 5-Interacts appropriately 90% of the time - Needs monitoring or encouragement for participation or interaction.  Problem Solving Problem Solving: 4-Solves basic 75 - 89% of the time/requires cueing 10 - 24% of the time  Memory Memory: 4-Recognizes or recalls 75 - 89% of the time/requires cueing 10 - 24% of the time  Medical Problem List and Plan: 1. Functional deficits secondary to debility to complications after lap chole, per PT making good progress 2. DVT Prophylaxis/Anticoagulation: continue lovenox---mild bruising noted 3. Pain Management: oxycodone prn. Pain fairly well controlled at present 4. Mood: LCSW to follow for evaluation and support.  5. Neuropsych: This patient is capable of making decisions on her own behalf. 6. Skin/Wound Care: Routine pressure relief measures. Daily drain mgt 7. Fluids/Electrolytes/Nutrition: intake generally improved  8. HTN: norvasc onboard. Improved control - clonidine prn for severe elevations  9. Tachycardia: continue coreg--HR in 90's 10. Hypokalemia:  potassium replacement--labs reviewed 11. GI: nausea yesterday---better after bm, still a little loose  -continue probiotic  -encourage fluids.     12. ABLA: hgb 12.1---still no signs of blood loss 13. SOB: Resolved with diuresis. Encourage IS while awake.  14. Post op biliary leak with biliary  peritonitis:    -PA has spoke to surgery who will follow up patient after discharge for drain mgt  -removal of stent in a 6-8 weeks per Dr. Ewing Schlein  -continue augmentin until discharge from rehab  LOS (Days) 8 A FACE TO FACE EVALUATION WAS PERFORMED  Valerie Sellers 12/28/2014 7:56 AM

## 2014-12-29 MED ORDER — CARVEDILOL 3.125 MG PO TABS: 3.1250 mg | ORAL_TABLET | Freq: Two times a day (BID) | ORAL | Status: DC

## 2014-12-29 MED ORDER — SACCHAROMYCES BOULARDII 250 MG PO CAPS: 250.0000 mg | ORAL_CAPSULE | Freq: Two times a day (BID) | ORAL | Status: DC

## 2014-12-29 MED ORDER — POLYSACCHARIDE IRON COMPLEX 150 MG PO CAPS: 150.0000 mg | ORAL_CAPSULE | Freq: Every day | ORAL | Status: DC

## 2014-12-29 MED ORDER — ATORVASTATIN CALCIUM 20 MG PO TABS: 20.0000 mg | ORAL_TABLET | Freq: Every day | ORAL | Status: AC

## 2014-12-29 MED ORDER — ONDANSETRON HCL 4 MG PO TABS: 4.0000 mg | ORAL_TABLET | Freq: Four times a day (QID) | ORAL | Status: DC | PRN

## 2014-12-29 MED ORDER — ACETAMINOPHEN 325 MG PO TABS: 325.0000 mg | ORAL_TABLET | ORAL | Status: AC | PRN

## 2014-12-29 NOTE — Progress Notes (Signed)
[facility]IC[facility] RE[facility]ABILITATION     PROGRESS NOTE    Subjective/Complaints: Feeling well today. Excited to go home. ROS: Pt denies fever, rash/itching, headache, blurred or double vision, nausea, vomiting, chest pain, palpitations, dysuria,    Objective: Vital Signs: Blood pressure 130/74, pulse 94, temperature 98 F (36.7 C), temperature source Oral, resp. rate 16, weight 58.4 kg (128 lb 12 oz), SpO2 96 %. No results found. No results for input(s): WBC, HGB, HCT, PLT in the last 72 hours. No results for input(s): NA, K, CL, GLUCOSE, BUN, CREATININE, CALCIUM in the last 72 hours.  Invalid input(s): CO CBG (last 3)    No results for input(s): GLUCAP in the last 72 hours.  Wt Readings from Last 3 Encounters:  12/28/14 58.4 kg (128 lb 12 oz)  12/20/14 67.384 kg (148 lb 8.9 oz)    Physical Exam:  Constitutional: She is oriented to person, place, and time. She appears well-developed and well-nourished.  HENT: oral mucosa slightly dry. edentulous Head: Normocephalic and atraumatic.  Eyes: Conjunctivae are normal. Pupils are equal, round, and reactive to light.  Neck: Normal range of motion. Neck supple.  Cardiovascular: Normal rate and regular rhythm.no murmurs  Respiratory: Effort normal and breath sounds normal.  GI: Soft. Bowel sounds are normal. She exhibits distension. There is tenderness along the right abdomen, mild, no guarding Patent drain RUQ. No drainage around tube site/drain, drainage with small amount of blood. Musculoskeletal: She exhibits no edema or tenderness.  Neurological: She is alert and oriented to person, place, and time.  Dysarthric speech per baseline. She is able to follow basic commands without difficulty. UE's 4/5 deltoid, bicep, tricep, wrist, hand. LE: 3/5 hf, 4-/5 ke and 4/5 ankles. No gross sensory deficits in any limb.  Skin: Skin is warm and dry. Abdomen noted above Psych: pt cooperative. Affect appropriate.    Assessment/Plan: 1. Functional deficits secondary to debility after multiple medical issues which require 3+ hours per day of interdisciplinary therapy in a comprehensive inpatient rehab setting. Physiatrist is providing close team supervision and 24 hour management of active medical problems listed below. Physiatrist and rehab team continue to assess barriers to discharge/monitor patient progress toward functional and medical goals. FIM: FIM - Bathing Bathing Steps Patient Completed: Chest, Right Arm, Left Arm, Abdomen, Front perineal area, Buttocks, Right upper leg, Left upper leg, Right lower leg (including foot), Left lower leg (including foot) Bathing: 5: Supervision: Safety issues/verbal cues  FIM - Upper Body Dressing/Undressing Upper body dressing/undressing: 7: Complete Independence: No helper FIM - Lower Body Dressing/Undressing Lower body dressing/undressing steps patient completed: Thread/unthread right underwear leg, Thread/unthread left underwear leg, Pull underwear up/down, Don/Doff right sock, Don/Doff left sock, Thread/unthread right pants leg, Thread/unthread left pants leg, Pull pants up/down, Fasten/unfasten pants Lower body dressing/undressing: 6: More than reasonable amount of time  FIM - Toileting Toileting steps completed by patient: Adjust clothing prior to toileting, Adjust clothing after toileting, Performs perineal hygiene Toileting Assistive Devices: Grab bar or rail for support Toileting: 6: More than reasonable amount of time  FIM - Radio producer Devices: Grab bars, Oncologist Transfers: 6-To toilet/ BSC, 6-From toilet/BSC  FIM - Control and instrumentation engineer Devices: Arm rests Bed/Chair Transfer: 6: Supine > Sit: No assist, 6: Sit > Supine: No assist, 7: Bed > Chair or W/C: No assist, 7: Chair or W/C > Bed: No assist  FIM - Locomotion: Wheelchair Locomotion: Wheelchair: 0: Activity did not occur (due to  ambulation ability)  FIM - Locomotion: Ambulation Locomotion: Ambulation Assistive Devices: Journalist, newspaper Ambulation/Gait Assistance: 6: Modified independent (Device/Increase time) Locomotion: Ambulation: 6: Travels 150 ft or more with assistive device/no helper  Comprehension Comprehension Mode: Auditory Comprehension: 5-Follows basic conversation/direction: With no assist  Expression Expression Mode: Verbal Expression: 5-Expresses basic needs/ideas: With no assist  Social Interaction Social Interaction: 6-Interacts appropriately with others with medication or extra time (anti-anxiety, antidepressant).  Problem Solving Problem Solving: 5-Solves basic 90% of the time/requires cueing < 10% of the time  Memory Memory: 5-Recognizes or recalls 90% of the time/requires cueing < 10% of the time  Medical Problem List and Plan: 1. Functional deficits secondary to debility to complications after lap chole -home today. Goals met 2. DVT Prophylaxis/Anticoagulation: continue lovenox---mild bruising noted 3. Pain Management: oxycodone prn. Pain fairly well controlled at present 4. Mood: LCSW to follow for evaluation and support.  5. Neuropsych: This patient is capable of making decisions on her own behalf. 6. Skin/Wound Care: Routine pressure relief measures. Daily drain mgt 7. Fluids/Electrolytes/Nutrition: intake generally improved  8. HTN: norvasc onboard. Improved control - clonidine prn for severe elevations  9. Tachycardia: continue coreg--HR in 90's 10. Hypokalemia:  potassium replacement--labs reviewed 11. GI: nausea yesterday---better after bm, still a little loose  -continue probiotic  -encourage fluids.     12. ABLA: hgb 12.1---still no signs of blood loss 13. SOB: Resolved with diuresis. Encourage IS while awake.  68. Post op biliary leak with biliary peritonitis:    -PA has spoke to surgery who will follow up patient after discharge for drain mgt  -removal of  stent in a 6-8 weeks per Dr. Watt Climes  -continue augmentin until discharge from rehab  LOS (Days) 9 A FACE TO FACE EVALUATION WAS PERFORMED  Harlow Basley T 12/29/2014 8:41 AM

## 2014-12-29 NOTE — Discharge Instructions (Signed)
Inpatient Rehab Discharge Instructions  Valerie Sellers Discharge date and time:  12/29/14  Activities/Precautions/ Functional Status: Activity: no lifting, driving, or strenuous exercise till cleared by MD Diet: regular diet Wound Care:  Empty drain daily and record output. Cleanse area around with normal saline, pat dry and apply dry dressing.   Functional status:  ___ No restrictions     ___ Walk up steps independently ___ 24/7 supervision/assistance   ___ Walk up steps with assistance _X__ Intermittent supervision/assistance  ___ Bathe/dress independently ___ Walk with walker     _X__ Bathe/dress with supervision ___ Walk Independently    ___ Shower independently ___ Walk with assistance    ___ Shower with assistance _X__ No alcohol     ___ Return to work/school ________  COMMUNITY REFERRALS UPON DISCHARGE:   Home Health:   RN  (We could not have PT and OT in the home due to your only having Medicaid.  Please continue home exercises our therapists gave you.)  Agency:  [facility] Phone:  [phone] Medical Equipment/Items Ordered:  Single point cane and shower chair  Agency/Supplier:  [facility]     Phone:   [phone] Other:  [facility] - Transitional Care Manager - Lyn Hollingshead, RN, BSN - [phone]   (They will follow up with you at home to make sure you are doing well and have everything you need.)  Special Instructions:    My questions have been answered and I understand these instructions. I will adhere to these goals and the provided educational materials after my discharge from the hospital.  Patient/Caregiver Signature _______________________________ Date __________  Clinician Signature _______________________________________ Date __________  Please bring this form and your medication list with you to all your follow-up doctor's appointments.

## 2014-12-29 NOTE — Progress Notes (Signed)
Social Work Discharge Note  The overall goal for the admission was met for:   Discharge location: Yes - home  Length of Stay: Yes - 9 days  Discharge activity level: Yes - modified independent  Home/community participation: Yes  Services provided included: MD, RD, PT, OT, RN, TR, Pharmacy and SW  Financial Services: Medicaid  Follow-up services arranged: Home Health: RN from [place], DME: single point cane and shower chair from [facility] and Patient/Family has no preference for HH/DME agencies  Comments (or additional information):  Pt will return to her home where her son, his girlfriend, pt's dtr and her two dtrs and their 2 dogs.  Pt is concerned about dogs and CSW has relayed this on to son who plans to discuss with his sister.  CSW has also asked for [facility] to follow pt at home to make sure pt is safe at home and the environment is conducive for her recovery.  They will follow her closely for 30 days.  Pt will have [place] RN only, as Medicaid will not pay for PT/OT.  DME provided and pt is pleased to be going home.  Son and dtr to pick pt up at d/c.  Son had his questions answered by Reesa Chew, PA a couple of days ago.   Patient/Family verbalized understanding of follow-up arrangements: Yes  Individual responsible for coordination of the follow-up plan: pt with support from her son as needed  Confirmed correct DME delivered: Trey Sailors 12/29/2014    Cristina Ceniceros, [name]

## 2015-01-05 DIAGNOSIS — R634 Abnormal weight loss: Secondary | ICD-10-CM | POA: Diagnosis not present

## 2015-01-05 DIAGNOSIS — Z9049 Acquired absence of other specified parts of digestive tract: Secondary | ICD-10-CM | POA: Diagnosis not present

## 2015-01-05 DIAGNOSIS — Z6821 Body mass index (BMI) 21.0-21.9, adult: Secondary | ICD-10-CM | POA: Diagnosis not present

## 2015-01-05 DIAGNOSIS — R531 Weakness: Secondary | ICD-10-CM | POA: Diagnosis not present

## 2015-01-11 ENCOUNTER — Telehealth: Payer: Self-pay | Admitting: Physical Medicine & Rehabilitation

## 2015-01-11 NOTE — Telephone Encounter (Signed)
Recently discharged from hospital and Ines Bloomer (son) has questions about food plan, patient can't seem to keep anything down, only thing she can eat is crackers and drink sprite.  Please call him at [phone].

## 2015-01-11 NOTE — Telephone Encounter (Signed)
Spoke with son who reports that patient has followed up with surgeon as well as primary care since discharge. N/V started back up few days past discharge.  Relayed to him that this sounds like issues similar to those she had with her gallbladder and that he needs to follow up with PMD and surgeon for evaluation. He state that patient at  [facility] ED currently.

## 2015-01-11 NOTE — Telephone Encounter (Signed)
Can you check on this for me? Please and thank you.

## 2015-01-12 DIAGNOSIS — R112 Nausea with vomiting, unspecified: Secondary | ICD-10-CM | POA: Diagnosis not present

## 2015-01-12 DIAGNOSIS — Z9882 Breast implant status: Secondary | ICD-10-CM | POA: Diagnosis not present

## 2015-01-12 DIAGNOSIS — J45909 Unspecified asthma, uncomplicated: Secondary | ICD-10-CM | POA: Diagnosis present

## 2015-01-12 DIAGNOSIS — Z9889 Other specified postprocedural states: Secondary | ICD-10-CM | POA: Diagnosis not present

## 2015-01-12 DIAGNOSIS — Z9013 Acquired absence of bilateral breasts and nipples: Secondary | ICD-10-CM | POA: Diagnosis present

## 2015-01-12 DIAGNOSIS — K219 Gastro-esophageal reflux disease without esophagitis: Secondary | ICD-10-CM | POA: Diagnosis present

## 2015-01-12 DIAGNOSIS — I10 Essential (primary) hypertension: Secondary | ICD-10-CM | POA: Diagnosis present

## 2015-01-12 DIAGNOSIS — Z853 Personal history of malignant neoplasm of breast: Secondary | ICD-10-CM | POA: Diagnosis not present

## 2015-01-12 DIAGNOSIS — T814XXA Infection following a procedure, initial encounter: Secondary | ICD-10-CM | POA: Diagnosis not present

## 2015-01-12 DIAGNOSIS — K750 Abscess of liver: Secondary | ICD-10-CM | POA: Diagnosis present

## 2015-01-12 DIAGNOSIS — E785 Hyperlipidemia, unspecified: Secondary | ICD-10-CM | POA: Diagnosis present

## 2015-01-23 ENCOUNTER — Ambulatory Visit: Payer: Medicaid Other | Admitting: Gynecologic Oncology

## 2015-01-27 ENCOUNTER — Ambulatory Visit: Payer: Medicaid Other | Attending: Gynecologic Oncology | Admitting: Gynecology

## 2015-01-27 ENCOUNTER — Encounter: Payer: Self-pay | Admitting: Gynecology

## 2015-01-27 VITALS — BP 127/66 | HR 94 | Temp 98.5°F | Resp 18 | Ht 63.0 in | Wt 112.8 lb

## 2015-01-27 DIAGNOSIS — R978 Other abnormal tumor markers: Secondary | ICD-10-CM | POA: Insufficient documentation

## 2015-01-27 DIAGNOSIS — R1900 Intra-abdominal and pelvic swelling, mass and lump, unspecified site: Secondary | ICD-10-CM | POA: Diagnosis not present

## 2015-01-27 NOTE — Patient Instructions (Signed)
Please return here primary gynecologist and had a repeat ultrasound in October.

## 2015-01-27 NOTE — Progress Notes (Signed)
Consult Note: Gyn-Onc   Valerie Sellers 59 y.o. female  Chief Complaint  Patient presents with  . 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 CT scan shows no evidence of advanced ovarian cancer. Elevated CA-125 was likely secondary to recent inflammatory process around her gallbladder bed.  Plan: I recommend the patient have a repeat pelvic ultrasound in October for reassessment of the mass. If it remains unchanged, I would recommend repeat again in 3 months later and thereafter discontinue. I would not obtain any further CA-125 values given the high probability of them being falsely positive.   HPI: 59 year old white female gravida 3 seen in consultation request of Dr. [name] regarding management of a newly diagnosed right adnexal mass. In July 2016, in the course of evaluating the patient with abdominal pain, the patient underwent a transvaginal ultrasound which showed a solid heterogeneous lesion most likely rising from the right ovary measuring 3.3 x 3.6 x 1.7 cm. There is no free fluid. Subsequently been August 2016 the patient underwent a CT scan of the abdomen and pelvis which did not show any evidence of other findings suggestive ovarian cancer.  The patient was found to have cholelithiasis and underwent laparoscopically-assisted cholecystectomy on on 12/12/2014.[name] Unfortunately she's had complications with the bile leak requiring readmissions and transfers to [facility]. Currently the patient has a drain in the gallbladder bed. CA-125 was obtained on 01/10/2015 which is elevated at 134 units per mL. (This was after the patient was known to have a bile leak and had a intraperitoneal drain placed)  The patient has no past history of gynecologic problems. Further, she has no history of family members with breast or ovarian cancer.  She had 3 on, K to pregnancies.  Review of Systems:10  point review of systems is negative except as noted in interval history.   Vitals: Blood pressure 127/66, pulse 94, temperature 98.5 F (36.9 C), resp. rate 18, height  (1.6 m), weight 112 lb 12.8 oz (51.166 kg), SpO2 98 %.  Physical Exam: General : The patient is a healthy woman in no acute distress.  HEENT: normocephalic, extraoccular movements normal; neck is supple without thyromegally  Lynphnodes: Supraclavicular and inguinal nodes not enlarged  Abdomen: Soft, non-tender, no ascites, no organomegally, no masses, no hernias there is no murmur abdominal drain containing straw-colored fluid. Pelvic:  EGBUS: Normal female  Vagina: Normal, no lesions  Urethra and Bladder: Normal, non-tender  Cervix: Normal  Uterus: Anterior normal shape size and consistency  Bi-manual examination: Non-tender; no adenxal masses or nodularity  Rectal: normal sphincter tone, no masses, no blood  Lower extremities: No edema or varicosities. Normal range of motion      No Known Allergies  Past Medical History  Diagnosis Date  . Hypertension   . Hypercholesteremia   . Asthma   . Diverticulosis   . History of breast cancer 2005    Past Surgical History  Procedure Laterality Date  . Cholecystectomy  12/12/2014  . Ercp N/A 12/17/2014    Procedure: ENDOSCOPIC RETROGRADE CHOLANGIOPANCREATOGRAPHY (ERCP);  Surgeon: [name], MD;  Location: [facility] ENDOSCOPY;  Service: Endoscopy;  Laterality: N/A;  . Bilateral total mastectomy with axillary lymph node dissection  2005    Surgery in [place] where patient was living at the time    Current Outpatient Prescriptions  Medication Sig Dispense Refill  . acetaminophen (TYLENOL)  325 MG tablet Take 1-2 tablets (325-650 mg total) by mouth every 4 (four) hours as needed for mild pain or moderate pain.    [name] albuterol (PROVENTIL HFA;VENTOLIN HFA) 108 (90 BASE) MCG/ACT inhaler Inhale 1 puff into the lungs every 6 (six) hours as needed for wheezing or shortness of  breath.    [name] alendronate (FOSAMAX) 70 MG tablet TAKE 1 TABLET WEEKLY  3  . amoxicillin-clavulanate (AUGMENTIN) 875-125 MG per tablet Take 1 tablet by mouth 2 (two) times daily.  0  . atorvastatin (LIPITOR) 20 MG tablet Take 1 tablet (20 mg total) by mouth daily at 6 PM. 30 tablet 1  . carvedilol (COREG) 3.125 MG tablet Take 1 tablet (3.125 mg total) by mouth 2 (two) times daily with a meal. 60 tablet 1  . iron polysaccharides (NIFEREX) 150 MG capsule Take 1 capsule (150 mg total) by mouth daily at 12 noon. 30 capsule 0  . lisinopril (PRINIVIL,ZESTRIL) 20 MG tablet Take 20 mg by mouth daily.  3  . loratadine (CLARITIN) 10 MG tablet Take 10 mg by mouth daily.    . montelukast (SINGULAIR) 10 MG tablet Take 10 mg by mouth daily.     [name] omeprazole (PRILOSEC) 20 MG capsule Take 20 mg by mouth daily.    . ondansetron (ZOFRAN) 4 MG tablet Take 1 tablet (4 mg total) by mouth every 6 (six) hours as needed for nausea or vomiting. 20 tablet 0  . oxyCODONE (OXY IR/ROXICODONE) 5 MG immediate release tablet TAKE 1 TABLET EVERY 4 HOURS AS NEEDED FOR PAIN  0  . aspirin 81 MG chewable tablet Chew 1 tablet (81 mg total) by mouth daily. (Patient not taking: Reported on 01/27/2015)    . Cholecalciferol (VITAMIN D3) 5000 UNITS CAPS Take 1 capsule by mouth daily.    . fluconazole (DIFLUCAN) 100 MG tablet Take 200 mg by mouth daily.  0  . saccharomyces boulardii (FLORASTOR) 250 MG capsule Take 1 capsule (250 mg total) by mouth 2 (two) times daily. Probiotic (Patient not taking: Reported on 01/27/2015) 60 capsule 0  . Vitamin D, Ergocalciferol, (DRISDOL) 50000 UNITS CAPS capsule Take by mouth.     No current facility-administered medications for this visit.    Social History   Social History  . Marital Status: Divorced    Spouse Name: N/A  . Number of Children: N/A  . Years of Education: N/A   Occupational History  . Not on file.   Social History Main Topics  . Smoking status: Never Smoker   . Smokeless  tobacco: Not on file  . Alcohol Use: No  . Drug Use: No  . Sexual Activity: Not on file   Other Topics Concern  . Not on file   Social History Narrative    Family History  Problem Relation Age of Onset  . Stomach cancer Mother   . Lung cancer Brother       Jeannette Corpus, MD 01/27/2015, 11:57 AM

## 2015-04-06 ENCOUNTER — Other Ambulatory Visit (HOSPITAL_BASED_OUTPATIENT_CLINIC_OR_DEPARTMENT_OTHER): Payer: Medicare Other

## 2015-04-06 ENCOUNTER — Encounter: Payer: Self-pay | Admitting: Gynecology

## 2015-04-06 ENCOUNTER — Ambulatory Visit: Payer: Medicare Other | Attending: Gynecology | Admitting: Gynecology

## 2015-04-06 VITALS — BP 135/70 | HR 80 | Temp 97.9°F | Resp 18 | Ht 63.0 in | Wt 125.5 lb

## 2015-04-06 DIAGNOSIS — R1900 Intra-abdominal and pelvic swelling, mass and lump, unspecified site: Secondary | ICD-10-CM

## 2015-04-06 DIAGNOSIS — R1909 Other intra-abdominal and pelvic swelling, mass and lump: Secondary | ICD-10-CM | POA: Insufficient documentation

## 2015-04-06 NOTE — Patient Instructions (Signed)
We will call you with the results of your CA 125 and ultrasound.  Please call for any questions or concerns.

## 2015-04-06 NOTE — Progress Notes (Signed)
Consult Note: Gyn-Onc   Valerie CohoSandra Dorow 59 y.o. female  Chief Complaint  Patient presents with  . 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 We will therefore reschedule these test for the near future and make decisions regarding management thereafter.  Interval history: Patient returns today as previously scheduled for follow-up. Unfortunately, she failed to follow through with obtaining a pelvic ultrasound and CA-125 values. We had hoped to have these prior to this office visit.  The patient herself is doing well she specifically denies any GI GU or pelvic symptoms. She is not having any bleeding or pelvic pain. Her functional status is excellent.  HPI: 59 year old white female gravida 3 seen in consultation request of Dr. Mariel AloeLawrence Bass regarding management of a newly diagnosed right adnexal mass. In July 2016, in the course of evaluating the patient with abdominal pain, the patient underwent a transvaginal ultrasound which showed a solid heterogeneous lesion most likely rising from the right ovary measuring 3.3 x 3.6 x 1.7 cm. There is no free fluid. Subsequently been August 2016 the patient underwent a CT scan of the abdomen and pelvis which did not show any evidence of other findings suggestive ovarian cancer.  The patient was found to have cholelithiasis and underwent laparoscopically-assisted cholecystectomy on on 12/12/2014.[name]. Unfortunately she's had complications with the bile leak requiring readmissions and transfers to [facility]. Currently the patient has a drain in the gallbladder bed. CA-125 was obtained on 01/10/2015 which is elevated at 134 units per mL. (This was after the patient was known to have a bile leak and had a intraperitoneal drain placed)  The patient has no past history of gynecologic problems. Further, she has no history  of family members with breast or ovarian cancer.  She had 3 on, K to pregnancies.  Review of Systems:10 point review of systems is negative except as noted in interval history.   Vitals: Blood pressure 135/70, pulse 80, temperature 97.9 F (36.6 C), temperature source Oral, resp. rate 18, height 5\' 3"  (1.6 m), weight 125 lb 8 oz (56.926 kg), SpO2 99 %.  Physical Exam: General : The patient is a healthy woman in no acute distress.  HEENT: normocephalic, extraoccular movements normal; neck is supple without thyromegally  Lynphnodes: Supraclavicular and inguinal nodes not enlarged  Abdomen: Soft, non-tender, no ascites, no organomegally, no masses, no hernias there is no murmur abdominal drain containing straw-colored fluid. Pelvic:  EGBUS: Normal female  Vagina: Normal, no lesions  Urethra and Bladder: Normal, non-tender  Cervix: Normal  Uterus: Anterior normal shape size and consistency  Bi-manual examination: Non-tender; no adenxal masses or nodularity  Rectal: normal sphincter tone, no masses, no blood  Lower extremities: No edema or varicosities. Normal range of motion      No Known Allergies  Past Medical History  Diagnosis Date  . Hypertension   . Hypercholesteremia   . Asthma   . Diverticulosis   . History of breast cancer 2005    Past Surgical History  Procedure Laterality Date  . Cholecystectomy  12/12/2014  . Ercp N/A 12/17/2014    Procedure: ENDOSCOPIC RETROGRADE CHOLANGIOPANCREATOGRAPHY (ERCP);  Surgeon: Vida RiggerMarc Magod, MD;  [facility];  Service: Endoscopy;  Laterality: N/A;  . Bilateral total mastectomy with axillary lymph node dissection  2005    [place]gery in [place] where patient was  living at the time    Current Outpatient Prescriptions  Medication Sig Dispense Refill  . albuterol (PROVENTIL HFA;VENTOLIN HFA) 108 (90 BASE) MCG/ACT inhaler Inhale 1 puff into the lungs every 6 (six) hours as needed for wheezing or shortness of breath.    [name]  alendronate (FOSAMAX) 70 MG tablet TAKE 1 TABLET WEEKLY  3  . atorvastatin (LIPITOR) 20 MG tablet Take 1 tablet (20 mg total) by mouth daily at 6 PM. 30 tablet 1  . carvedilol (COREG) 3.125 MG tablet Take 1 tablet (3.125 mg total) by mouth 2 (two) times daily with a meal. 60 tablet 1  . Cholecalciferol (VITAMIN D3) 5000 UNITS CAPS Take 1 capsule by mouth daily.    . iron polysaccharides (NIFEREX) 150 MG capsule Take 1 capsule (150 mg total) by mouth daily at 12 noon. 30 capsule 0  . lisinopril (PRINIVIL,ZESTRIL) 20 MG tablet Take 20 mg by mouth daily.  3  . loratadine (CLARITIN) 10 MG tablet Take 10 mg by mouth daily.    . montelukast (SINGULAIR) 10 MG tablet Take 10 mg by mouth daily.     . Vitamin D, Ergocalciferol, (DRISDOL) 50000 UNITS CAPS capsule Take by mouth.    [name] acetaminophen (TYLENOL) 325 MG tablet Take 1-2 tablets (325-650 mg total) by mouth every 4 (four) hours as needed for mild pain or moderate pain. (Patient not taking: Reported on 04/06/2015)    . ondansetron (ZOFRAN) 4 MG tablet Take 1 tablet (4 mg total) by mouth every 6 (six) hours as needed for nausea or vomiting. 20 tablet 0   No current facility-administered medications for this visit.    Social History   Social History  . Marital Status: Divorced    Spouse Name: N/A  . Number of Children: N/A  . Years of Education: N/A   Occupational History  . Not on file.   Social History Main Topics  . Smoking status: Never Smoker   . Smokeless tobacco: Not on file  . Alcohol Use: No  . Drug Use: No  . Sexual Activity: Not on file   Other Topics Concern  . Not on file   Social History Narrative    Family History  Problem Relation Age of Onset  . Stomach cancer Mother   . Lung cancer Brother       Jeannette Corpus, MD 04/06/2015, 12:29 PM

## 2015-04-07 ENCOUNTER — Telehealth: Payer: Self-pay

## 2015-04-07 LAB — CA 125: CA 125: 14 U/mL (ref <35)

## 2015-04-07 NOTE — Telephone Encounter (Signed)
[facility] to contact the patient and update with CA 125 level is 14 within normal range. Patient contacted , no answer , left a detailed message with call back information if additional questions arise.

## 2015-04-12 ENCOUNTER — Ambulatory Visit (HOSPITAL_COMMUNITY)
Admission: RE | Admit: 2015-04-12 | Discharge: 2015-04-12 | Disposition: A | Discharge status: Home / Self Care | Payer: Medicare Other | Source: Ambulatory Visit | Attending: Gynecology | Admitting: Gynecology

## 2015-04-12 ENCOUNTER — Telehealth: Payer: Self-pay

## 2015-04-12 DIAGNOSIS — R1909 Other intra-abdominal and pelvic swelling, mass and lump: Secondary | ICD-10-CM | POA: Insufficient documentation

## 2015-04-12 DIAGNOSIS — N9489 Other specified conditions associated with female genital organs and menstrual cycle: Secondary | ICD-10-CM

## 2015-04-12 DIAGNOSIS — R1900 Intra-abdominal and pelvic swelling, mass and lump, unspecified site: Secondary | ICD-10-CM

## 2015-04-12 NOTE — Telephone Encounter (Signed)
[facility]AP[facility] to contact the patient and update with U/S results and give Dr Nelwyn Salisburylarke-Pearson's recommendations. Patient contacted , no answer , left a detailed message to repeat U/S in 3 months to monitor the Right Ovary , call back information given if additional questions or concerns arise .

## 2015-04-14 ENCOUNTER — Ambulatory Visit (HOSPITAL_COMMUNITY): Admission: RE | Admit: 2015-04-14 | Payer: Medicare Other | Source: Ambulatory Visit

## 2015-04-25 ENCOUNTER — Telehealth: Payer: Self-pay

## 2015-04-25 ENCOUNTER — Other Ambulatory Visit: Payer: Self-pay

## 2015-04-25 DIAGNOSIS — R1900 Intra-abdominal and pelvic swelling, mass and lump, unspecified site: Secondary | ICD-10-CM

## 2015-04-25 NOTE — Telephone Encounter (Signed)
Patient contacted to update with recommendation that Dr Stanford Breedlarke-Pearson is recommending that she have an  U/S to re-evaluate pelvic mass a CA 125 level and MD follow up in early February 2017. Attempted to reach the patient , no answer , left a detailed message with call back requested , call back information provided .

## 2015-04-28 NOTE — Telephone Encounter (Signed)
04/28/2015 : Attempted to contact the patient again to schedule U/S , CA 125 level and follow up appointment in early February 2017 . No answer , left a detailed message with ball back requested to schedule the labs , procedure and MD visit . Call back information given with request for call back .

## 2015-05-09 ENCOUNTER — Telehealth: Payer: Self-pay

## 2015-05-09 NOTE — Telephone Encounter (Signed)
Attempted to contact the patient for the fourth time , no answer and no voice mailbox available . Today the patient's phone number has been either changed or disconnected NP updated . Valerie Sellers , APNP aware , orders received to send a letter with dates for MD follow up visit , lab draw and vaginal U/S for February 10 , 2017 , MD at 1:15 , labs 1 PM and vaginal U/S at 11 AM . Letter was written and sent as ordered.

## 2015-05-11 DIAGNOSIS — L659 Nonscarring hair loss, unspecified: Secondary | ICD-10-CM | POA: Diagnosis not present

## 2015-05-11 DIAGNOSIS — I10 Essential (primary) hypertension: Secondary | ICD-10-CM | POA: Diagnosis not present

## 2015-05-11 DIAGNOSIS — E785 Hyperlipidemia, unspecified: Secondary | ICD-10-CM | POA: Diagnosis not present

## 2015-05-11 DIAGNOSIS — M810 Age-related osteoporosis without current pathological fracture: Secondary | ICD-10-CM | POA: Diagnosis not present

## 2015-05-11 DIAGNOSIS — J45909 Unspecified asthma, uncomplicated: Secondary | ICD-10-CM | POA: Diagnosis not present

## 2015-05-12 DIAGNOSIS — E785 Hyperlipidemia, unspecified: Secondary | ICD-10-CM | POA: Diagnosis not present

## 2015-05-12 DIAGNOSIS — I10 Essential (primary) hypertension: Secondary | ICD-10-CM | POA: Diagnosis not present

## 2015-05-12 DIAGNOSIS — J45909 Unspecified asthma, uncomplicated: Secondary | ICD-10-CM | POA: Diagnosis not present

## 2015-05-12 DIAGNOSIS — M810 Age-related osteoporosis without current pathological fracture: Secondary | ICD-10-CM | POA: Diagnosis not present

## 2015-05-12 DIAGNOSIS — L659 Nonscarring hair loss, unspecified: Secondary | ICD-10-CM | POA: Diagnosis not present

## 2015-05-13 DIAGNOSIS — L659 Nonscarring hair loss, unspecified: Secondary | ICD-10-CM | POA: Diagnosis not present

## 2015-05-13 DIAGNOSIS — E785 Hyperlipidemia, unspecified: Secondary | ICD-10-CM | POA: Diagnosis not present

## 2015-05-13 DIAGNOSIS — I10 Essential (primary) hypertension: Secondary | ICD-10-CM | POA: Diagnosis not present

## 2015-05-13 DIAGNOSIS — J45909 Unspecified asthma, uncomplicated: Secondary | ICD-10-CM | POA: Diagnosis not present

## 2015-05-13 DIAGNOSIS — M810 Age-related osteoporosis without current pathological fracture: Secondary | ICD-10-CM | POA: Diagnosis not present

## 2015-05-14 DIAGNOSIS — E785 Hyperlipidemia, unspecified: Secondary | ICD-10-CM | POA: Diagnosis not present

## 2015-05-14 DIAGNOSIS — M810 Age-related osteoporosis without current pathological fracture: Secondary | ICD-10-CM | POA: Diagnosis not present

## 2015-05-14 DIAGNOSIS — J45909 Unspecified asthma, uncomplicated: Secondary | ICD-10-CM | POA: Diagnosis not present

## 2015-05-14 DIAGNOSIS — L659 Nonscarring hair loss, unspecified: Secondary | ICD-10-CM | POA: Diagnosis not present

## 2015-05-14 DIAGNOSIS — I10 Essential (primary) hypertension: Secondary | ICD-10-CM | POA: Diagnosis not present

## 2015-07-12 ENCOUNTER — Other Ambulatory Visit: Payer: Self-pay | Admitting: Physical Medicine and Rehabilitation

## 2015-07-13 ENCOUNTER — Other Ambulatory Visit: Payer: Self-pay | Admitting: Gynecologic Oncology

## 2015-07-13 DIAGNOSIS — R1909 Other intra-abdominal and pelvic swelling, mass and lump: Secondary | ICD-10-CM

## 2015-07-13 DIAGNOSIS — R1900 Intra-abdominal and pelvic swelling, mass and lump, unspecified site: Secondary | ICD-10-CM

## 2015-07-14 ENCOUNTER — Ambulatory Visit (HOSPITAL_COMMUNITY): Admission: RE | Admit: 2015-07-14 | Payer: Medicare Other | Source: Ambulatory Visit

## 2015-07-14 ENCOUNTER — Other Ambulatory Visit: Payer: Medicare Other

## 2015-07-14 ENCOUNTER — Ambulatory Visit: Payer: Medicare Other | Admitting: Gynecology

## 2015-07-18 ENCOUNTER — Telehealth: Payer: Self-pay | Admitting: *Deleted

## 2015-07-18 NOTE — Telephone Encounter (Signed)
Notified pt of future scheduled appointments.  At the patient's request patient was scheduled for a U/S and lab at [facility]. Her appointment is 07/20/2015 @ 1:30. The patient is also scheduled to see Dr. Stanford Breed on 07/28/15 @ 11:15. Pt agreed with appointment time and date.

## 2015-07-20 DIAGNOSIS — R1900 Intra-abdominal and pelvic swelling, mass and lump, unspecified site: Secondary | ICD-10-CM | POA: Diagnosis not present

## 2015-07-20 DIAGNOSIS — N83201 Unspecified ovarian cyst, right side: Secondary | ICD-10-CM | POA: Diagnosis not present

## 2015-07-25 DIAGNOSIS — A084 Viral intestinal infection, unspecified: Secondary | ICD-10-CM | POA: Diagnosis not present

## 2015-07-28 ENCOUNTER — Ambulatory Visit (HOSPITAL_COMMUNITY): Payer: Medicare Other

## 2015-07-28 ENCOUNTER — Encounter: Payer: Self-pay | Admitting: Gynecology

## 2015-07-28 ENCOUNTER — Ambulatory Visit: Payer: Medicare Other | Attending: Gynecology | Admitting: Gynecology

## 2015-07-28 VITALS — BP 121/77 | HR 73 | Temp 98.0°F | Resp 18 | Ht 63.0 in | Wt 138.2 lb

## 2015-07-28 DIAGNOSIS — Z801 Family history of malignant neoplasm of trachea, bronchus and lung: Secondary | ICD-10-CM | POA: Diagnosis not present

## 2015-07-28 DIAGNOSIS — K5790 Diverticulosis of intestine, part unspecified, without perforation or abscess without bleeding: Secondary | ICD-10-CM | POA: Diagnosis not present

## 2015-07-28 DIAGNOSIS — R1900 Intra-abdominal and pelvic swelling, mass and lump, unspecified site: Secondary | ICD-10-CM | POA: Insufficient documentation

## 2015-07-28 DIAGNOSIS — I10 Essential (primary) hypertension: Secondary | ICD-10-CM | POA: Diagnosis not present

## 2015-07-28 DIAGNOSIS — R971 Elevated cancer antigen 125 [CA 125]: Secondary | ICD-10-CM | POA: Insufficient documentation

## 2015-07-28 DIAGNOSIS — E7800 Pure hypercholesterolemia, unspecified: Secondary | ICD-10-CM | POA: Insufficient documentation

## 2015-07-28 DIAGNOSIS — J45909 Unspecified asthma, uncomplicated: Secondary | ICD-10-CM | POA: Diagnosis not present

## 2015-07-28 DIAGNOSIS — Z800 Family history of malignant neoplasm of digestive organs: Secondary | ICD-10-CM | POA: Diagnosis not present

## 2015-07-28 DIAGNOSIS — Z853 Personal history of malignant neoplasm of breast: Secondary | ICD-10-CM | POA: Diagnosis not present

## 2015-07-28 NOTE — Patient Instructions (Signed)
Plan to follow up with [place], NP.  Please call for any questions or concerns.  No further ultrasounds or CA 125 levels needed unless symptoms such as pain develop.

## 2015-07-28 NOTE — Progress Notes (Signed)
Consult Note: Gyn-Onc   Valerie Sellers 60 y.o. female  Chief Complaint  Patient presents with  . 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 I'm confident that this is asymptomatic benign ovarian neoplasm. I do not believe further imaging or monitoring of tumor markers is necessary unless the patient becomes symptomatic. We will discharge the patient from our follow-up return her to her primary care physician. The patient is given instructions regarding warning signs especially of abdominal or pelvic pain or pressure that might necessitate further evaluation.  Interval history: Patient returns today as previously scheduled for follow-up overall she's doing well. She denies any pelvic symptoms.  In November her CA-125 value was 14 units per mL and the right adnexal mass measured 4.7 x 1.9 x 2.5 cm. A repeat ultrasound on 07/18/2015 revealed the right ovarian mass now measures 3.7 x 1.8 x 3.2 cm and her CA-125 value is 19 units per mL. The patient herself is doing well she specifically denies any GI GU or pelvic symptoms. She is not having any bleeding or pelvic pain. Her functional status is excellent.  HPI: 60 year old white female gravida 3 seen in consultation request of Dr. [name] regarding management of a newly diagnosed right adnexal mass. In July 2016, in the course of evaluating the patient with abdominal pain, the patient underwent a transvaginal ultrasound which showed a solid heterogeneous lesion most likely rising from the right ovary measuring 3.3 x 3.6 x 1.7 cm. There is no free fluid. Subsequently been August 2016 the patient underwent a CT scan of the abdomen and pelvis which did not show any evidence of other findings suggestive ovarian cancer.  The patient was found to have cholelithiasis and underwent laparoscopically-assisted  cholecystectomy on on 12/12/2014.[name] Unfortunately she's had complications with the bile leak requiring readmissions and transfers to [facility]. Currently the patient has a drain in the gallbladder bed. CA-125 was obtained on 01/10/2015 which is elevated at 134 units per mL. (This was after the patient was known to have a bile leak and had a intraperitoneal drain placed)  The patient has no past history of gynecologic problems. Further, she has no history of family members with breast or ovarian cancer.  She had 3 on, K to pregnancies.  Review of Systems:10 point review of systems is negative except as noted in interval history.   Vitals: Blood pressure 121/77, pulse 73, temperature 98 F (36.7 C), temperature source Oral, resp. rate 18, height  (1.6 m), weight 138 lb 3.2 oz (62.687 kg), SpO2 100 %.  Physical Exam: General : The patient is a healthy woman in no acute distress.  HEENT: normocephalic, extraoccular movements normal; neck is supple without thyromegally  Lynphnodes: Supraclavicular and inguinal nodes not enlarged  Abdomen: Soft, non-tender, no ascites, no organomegally, no masses, no hernias there is no murmur abdominal drain containing straw-colored fluid. Pelvic:  EGBUS: Normal female  Vagina: Normal, no lesions  Urethra and Bladder: Normal, non-tender  Cervix: Normal  Uterus: Anterior normal shape size and consistency  Bi-manual examination: Non-tender; no adenxal masses or nodularity  Rectal: normal sphincter tone, no masses, no blood  Lower extremities: No edema or varicosities. Normal range of motion      No Known Allergies  Past Medical History  Diagnosis Date  . Hypertension   .  Hypercholesteremia   . Asthma   . Diverticulosis   . History of breast cancer 2005    Past Surgical History  Procedure Laterality Date  . Cholecystectomy  12/12/2014  . Ercp N/A 12/17/2014    Procedure: ENDOSCOPIC RETROGRADE CHOLANGIOPANCREATOGRAPHY (ERCP);  Surgeon: [name], MD;  Location: [facility] ENDOSCOPY;  Service: Endoscopy;  Laterality: N/A;  . Bilateral total mastectomy with axillary lymph node dissection  2005    Surgery in [place] where patient was living at the time    Current Outpatient Prescriptions  Medication Sig Dispense Refill  . acetaminophen (TYLENOL) 325 MG tablet Take 1-2 tablets (325-650 mg total) by mouth every 4 (four) hours as needed for mild pain or moderate pain.    [name] albuterol (PROVENTIL HFA;VENTOLIN HFA) 108 (90 BASE) MCG/ACT inhaler Inhale 1 puff into the lungs every 6 (six) hours as needed for wheezing or shortness of breath.    [name] alendronate (FOSAMAX) 70 MG tablet TAKE 1 TABLET WEEKLY  3  . atorvastatin (LIPITOR) 20 MG tablet Take 1 tablet (20 mg total) by mouth daily at 6 PM. 30 tablet 1  . carvedilol (COREG) 3.125 MG tablet Take 1 tablet (3.125 mg total) by mouth 2 (two) times daily with a meal. 60 tablet 1  . Cholecalciferol (VITAMIN D3) 5000 UNITS CAPS Take 1 capsule by mouth daily.    . iron polysaccharides (NIFEREX) 150 MG capsule Take 1 capsule (150 mg total) by mouth daily at 12 noon. 30 capsule 0  . lisinopril (PRINIVIL,ZESTRIL) 20 MG tablet Take 20 mg by mouth daily.  3  . loratadine (CLARITIN) 10 MG tablet Take 10 mg by mouth daily.    . montelukast (SINGULAIR) 10 MG tablet Take 10 mg by mouth daily.     . ondansetron (ZOFRAN) 4 MG tablet Take 1 tablet (4 mg total) by mouth every 6 (six) hours as needed for nausea or vomiting. 20 tablet 0  . Vitamin D, Ergocalciferol, (DRISDOL) 50000 UNITS CAPS capsule Take by mouth.     No current facility-administered medications for this visit.    Social History   Social History  . Marital Status: Divorced    Spouse Name: N/A  . Number of Children: N/A  . Years of Education: N/A   Occupational History  . Not on file.   Social History Main Topics  . Smoking status: Never Smoker   . Smokeless tobacco: Not on file  . Alcohol Use: No  . Drug Use: No  . Sexual Activity: Not  on file   Other Topics Concern  . Not on file   Social History Narrative    Family History  Problem Relation Age of Onset  . Stomach cancer Mother   . Lung cancer Brother       Jeannette Corpus, MD 07/28/2015, 10:16 AM

## 2015-09-20 DIAGNOSIS — M810 Age-related osteoporosis without current pathological fracture: Secondary | ICD-10-CM | POA: Diagnosis not present

## 2015-09-20 DIAGNOSIS — J019 Acute sinusitis, unspecified: Secondary | ICD-10-CM | POA: Diagnosis not present

## 2015-09-20 DIAGNOSIS — E785 Hyperlipidemia, unspecified: Secondary | ICD-10-CM | POA: Diagnosis not present

## 2015-09-20 DIAGNOSIS — R799 Abnormal finding of blood chemistry, unspecified: Secondary | ICD-10-CM | POA: Diagnosis not present

## 2015-09-20 DIAGNOSIS — I10 Essential (primary) hypertension: Secondary | ICD-10-CM | POA: Diagnosis not present

## 2015-09-20 DIAGNOSIS — R17 Unspecified jaundice: Secondary | ICD-10-CM | POA: Diagnosis not present

## 2015-09-20 DIAGNOSIS — Z6826 Body mass index (BMI) 26.0-26.9, adult: Secondary | ICD-10-CM | POA: Diagnosis not present

## 2015-09-20 DIAGNOSIS — J4530 Mild persistent asthma, uncomplicated: Secondary | ICD-10-CM | POA: Diagnosis not present

## 2015-10-18 DIAGNOSIS — R140 Abdominal distension (gaseous): Secondary | ICD-10-CM | POA: Diagnosis not present

## 2015-10-18 DIAGNOSIS — Z6826 Body mass index (BMI) 26.0-26.9, adult: Secondary | ICD-10-CM | POA: Diagnosis not present

## 2015-10-18 DIAGNOSIS — R17 Unspecified jaundice: Secondary | ICD-10-CM | POA: Diagnosis not present

## 2015-10-23 DIAGNOSIS — R17 Unspecified jaundice: Secondary | ICD-10-CM | POA: Diagnosis not present

## 2015-10-23 DIAGNOSIS — Z9049 Acquired absence of other specified parts of digestive tract: Secondary | ICD-10-CM | POA: Diagnosis not present

## 2015-10-26 DIAGNOSIS — R17 Unspecified jaundice: Secondary | ICD-10-CM | POA: Diagnosis not present

## 2015-11-15 DIAGNOSIS — E7800 Pure hypercholesterolemia, unspecified: Secondary | ICD-10-CM | POA: Diagnosis not present

## 2015-11-15 DIAGNOSIS — R112 Nausea with vomiting, unspecified: Secondary | ICD-10-CM | POA: Diagnosis not present

## 2015-11-15 DIAGNOSIS — Z79899 Other long term (current) drug therapy: Secondary | ICD-10-CM | POA: Diagnosis not present

## 2015-11-15 DIAGNOSIS — I10 Essential (primary) hypertension: Secondary | ICD-10-CM | POA: Diagnosis not present

## 2015-11-15 DIAGNOSIS — N390 Urinary tract infection, site not specified: Secondary | ICD-10-CM | POA: Diagnosis not present

## 2015-11-15 DIAGNOSIS — R109 Unspecified abdominal pain: Secondary | ICD-10-CM | POA: Diagnosis not present

## 2015-11-15 DIAGNOSIS — J45909 Unspecified asthma, uncomplicated: Secondary | ICD-10-CM | POA: Diagnosis not present

## 2015-11-15 DIAGNOSIS — N3000 Acute cystitis without hematuria: Secondary | ICD-10-CM | POA: Diagnosis not present

## 2015-11-16 DIAGNOSIS — R109 Unspecified abdominal pain: Secondary | ICD-10-CM | POA: Diagnosis not present

## 2015-11-23 DIAGNOSIS — N3090 Cystitis, unspecified without hematuria: Secondary | ICD-10-CM | POA: Diagnosis not present

## 2015-11-23 DIAGNOSIS — Z6827 Body mass index (BMI) 27.0-27.9, adult: Secondary | ICD-10-CM | POA: Diagnosis not present

## 2015-11-29 ENCOUNTER — Ambulatory Visit
Admission: RE | Admit: 2015-11-29 | Discharge: 2015-11-29 | Disposition: A | Discharge status: Home / Self Care | Payer: Medicare Other | Source: Ambulatory Visit | Attending: Gastroenterology | Admitting: Gastroenterology

## 2015-11-29 ENCOUNTER — Other Ambulatory Visit: Payer: Self-pay | Admitting: Gastroenterology

## 2015-11-29 DIAGNOSIS — Z0389 Encounter for observation for other suspected diseases and conditions ruled out: Secondary | ICD-10-CM | POA: Diagnosis not present

## 2015-11-29 DIAGNOSIS — R197 Diarrhea, unspecified: Secondary | ICD-10-CM

## 2015-11-29 DIAGNOSIS — Z9889 Other specified postprocedural states: Secondary | ICD-10-CM

## 2015-11-29 DIAGNOSIS — R1084 Generalized abdominal pain: Secondary | ICD-10-CM

## 2016-01-28 DIAGNOSIS — J45901 Unspecified asthma with (acute) exacerbation: Secondary | ICD-10-CM | POA: Diagnosis not present

## 2016-01-28 DIAGNOSIS — R0600 Dyspnea, unspecified: Secondary | ICD-10-CM | POA: Diagnosis not present

## 2016-01-30 DIAGNOSIS — J45901 Unspecified asthma with (acute) exacerbation: Secondary | ICD-10-CM | POA: Diagnosis not present

## 2016-01-30 DIAGNOSIS — Z6827 Body mass index (BMI) 27.0-27.9, adult: Secondary | ICD-10-CM | POA: Diagnosis not present

## 2016-02-03 IMAGING — CR DG CHEST 1V PORT
1 series · 1 of 1 positions shown · non-contrast
Comparison: None.

CLINICAL DATA: History of asthma.  History of hypertension.

EXAM:
PORTABLE CHEST - 1 VIEW

[AP]
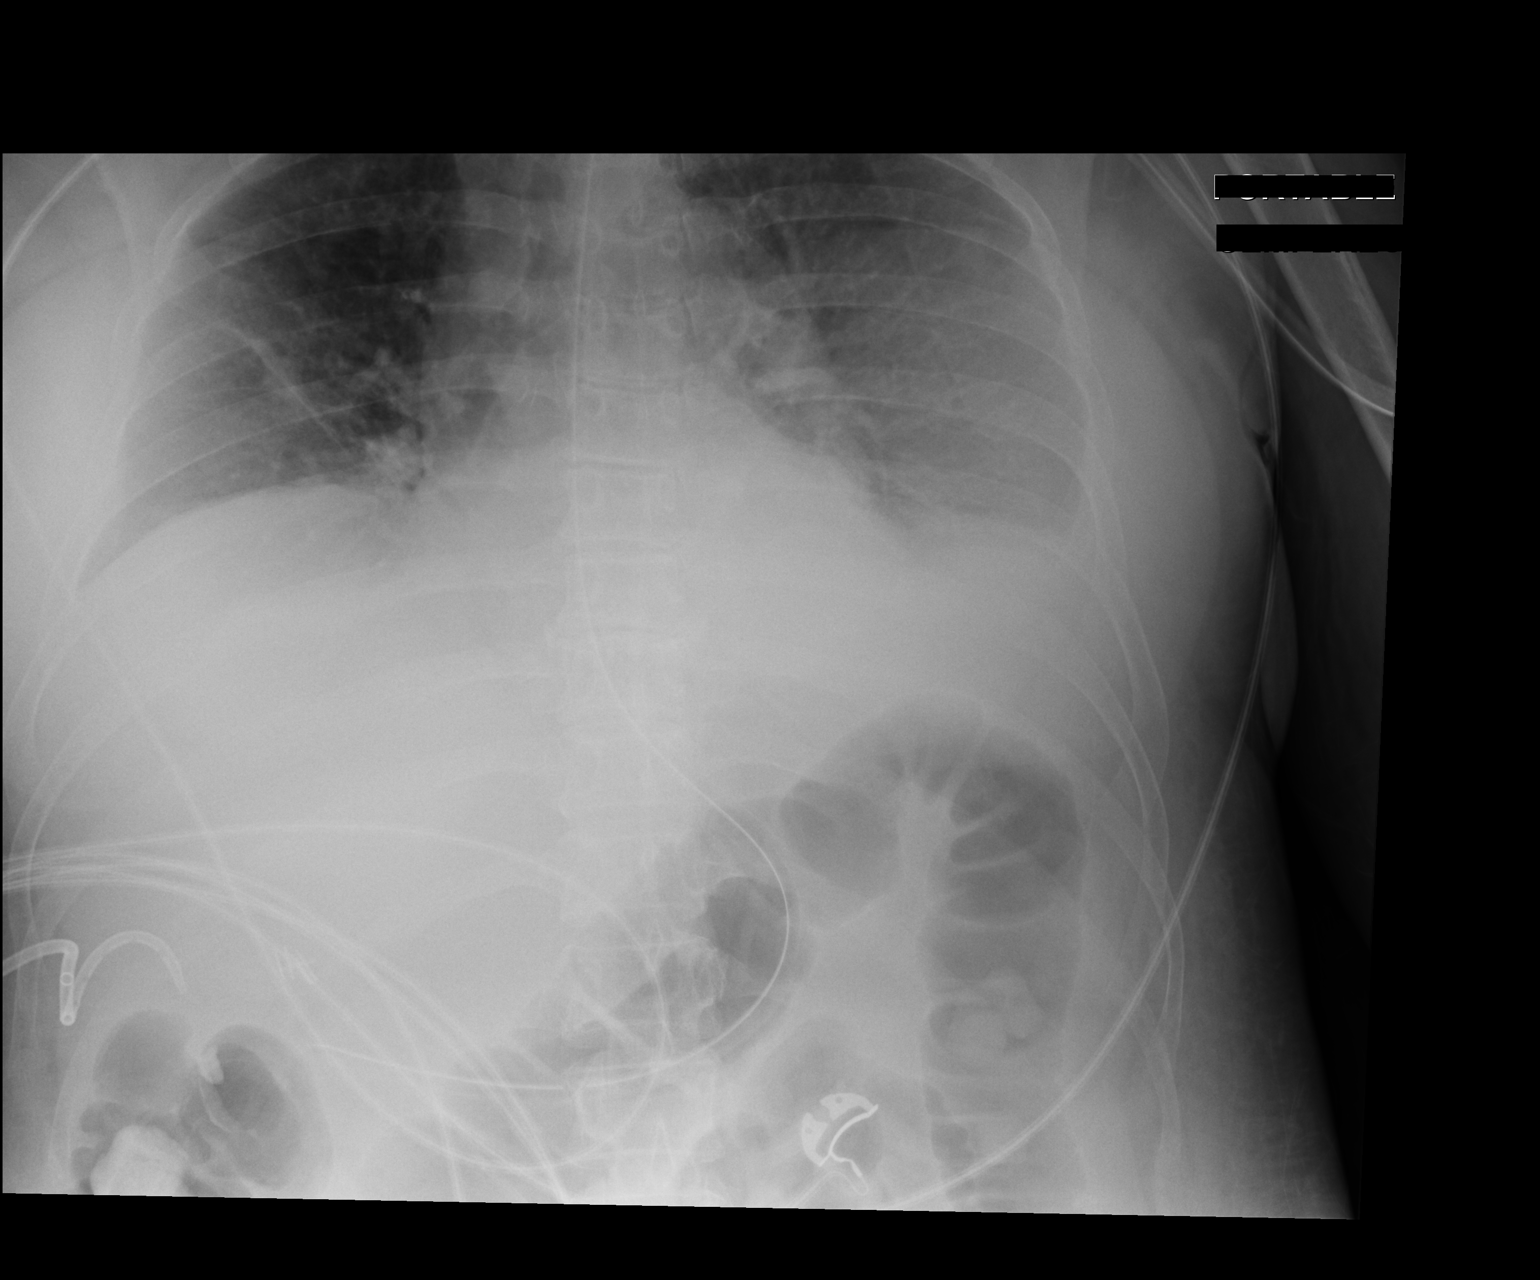

[1 of 1 positions shown; findings below may reference images not displayed]

FINDINGS: Nasogastric tube is in place with tip to level of the distal
stomach. Film is made with low lung volumes. Heart size is probably
normal. There is small left pleural effusion. Opacity at the left
lung base obscures the medial left hemidiaphragm consistent
atelectasis or infiltrate. Consider PA and lateral chest if the
patient is able.
IMPRESSION: 1. Small left pleural effusion.
2. Left lower lobe atelectasis or infiltrate.

## 2016-02-16 DIAGNOSIS — N3001 Acute cystitis with hematuria: Secondary | ICD-10-CM | POA: Diagnosis not present

## 2016-02-16 DIAGNOSIS — R1032 Left lower quadrant pain: Secondary | ICD-10-CM | POA: Diagnosis not present

## 2016-02-16 DIAGNOSIS — R319 Hematuria, unspecified: Secondary | ICD-10-CM | POA: Diagnosis not present

## 2016-02-16 DIAGNOSIS — N390 Urinary tract infection, site not specified: Secondary | ICD-10-CM | POA: Diagnosis not present

## 2016-02-27 DIAGNOSIS — N390 Urinary tract infection, site not specified: Secondary | ICD-10-CM | POA: Diagnosis not present

## 2016-02-27 DIAGNOSIS — Z6827 Body mass index (BMI) 27.0-27.9, adult: Secondary | ICD-10-CM | POA: Diagnosis not present

## 2016-02-27 DIAGNOSIS — J45901 Unspecified asthma with (acute) exacerbation: Secondary | ICD-10-CM | POA: Diagnosis not present

## 2016-02-27 DIAGNOSIS — Z23 Encounter for immunization: Secondary | ICD-10-CM | POA: Diagnosis not present

## 2016-03-14 DIAGNOSIS — R05 Cough: Secondary | ICD-10-CM | POA: Diagnosis not present

## 2016-03-14 DIAGNOSIS — R0602 Shortness of breath: Secondary | ICD-10-CM | POA: Diagnosis not present

## 2016-03-14 DIAGNOSIS — J45909 Unspecified asthma, uncomplicated: Secondary | ICD-10-CM | POA: Diagnosis not present

## 2016-03-14 DIAGNOSIS — J189 Pneumonia, unspecified organism: Secondary | ICD-10-CM | POA: Diagnosis not present

## 2016-03-19 DIAGNOSIS — J45901 Unspecified asthma with (acute) exacerbation: Secondary | ICD-10-CM | POA: Diagnosis not present

## 2016-03-19 DIAGNOSIS — Z6827 Body mass index (BMI) 27.0-27.9, adult: Secondary | ICD-10-CM | POA: Diagnosis not present

## 2016-03-27 DIAGNOSIS — R635 Abnormal weight gain: Secondary | ICD-10-CM | POA: Diagnosis not present

## 2016-03-27 DIAGNOSIS — R140 Abdominal distension (gaseous): Secondary | ICD-10-CM | POA: Diagnosis not present

## 2016-03-27 DIAGNOSIS — R748 Abnormal levels of other serum enzymes: Secondary | ICD-10-CM | POA: Diagnosis not present

## 2016-04-02 DIAGNOSIS — J4530 Mild persistent asthma, uncomplicated: Secondary | ICD-10-CM | POA: Diagnosis not present

## 2016-04-02 DIAGNOSIS — Z6827 Body mass index (BMI) 27.0-27.9, adult: Secondary | ICD-10-CM | POA: Diagnosis not present

## 2016-04-02 DIAGNOSIS — E785 Hyperlipidemia, unspecified: Secondary | ICD-10-CM | POA: Diagnosis not present

## 2016-04-16 DIAGNOSIS — L918 Other hypertrophic disorders of the skin: Secondary | ICD-10-CM | POA: Diagnosis not present

## 2016-05-13 DIAGNOSIS — M810 Age-related osteoporosis without current pathological fracture: Secondary | ICD-10-CM | POA: Diagnosis not present

## 2016-07-30 ENCOUNTER — Encounter: Payer: Self-pay | Admitting: Allergy

## 2016-07-30 ENCOUNTER — Ambulatory Visit (INDEPENDENT_AMBULATORY_CARE_PROVIDER_SITE_OTHER): Payer: 59 | Admitting: Allergy

## 2016-07-30 VITALS — BP 120/82 | HR 88 | Temp 98.6°F | Resp 22 | Ht 60.12 in | Wt 149.8 lb

## 2016-07-30 DIAGNOSIS — J3089 Other allergic rhinitis: Secondary | ICD-10-CM | POA: Diagnosis not present

## 2016-07-30 DIAGNOSIS — J4550 Severe persistent asthma, uncomplicated: Secondary | ICD-10-CM | POA: Diagnosis not present

## 2016-07-30 NOTE — Progress Notes (Signed)
New Patient Note  RE: Valerie Sellers MRN: [id number] DOB: 1956/02/25 Date of Office Visit: 07/30/2016  Referring provider: Hal Morales, NP Primary care provider: Hal Morales, NP  Chief Complaint: Worsening asthma  History of present illness: Valerie Sellers is a 61 y.o. female presenting today for consultation for asthma.  She has asthma diagnosed after she had her 2nd son 34 years ago.  She states her asthma is "not getting better."   She has a albuterol nebulizer at home that she uses about every 4 hours every day for several months now.   She uses her albuterol inhaler about 3 times a day as needed as well.  She reports difficulty breathing, cough, wheeze and chest tightness.  She gets temporary relief with albuterol use.  She reports night time awakenings nightly.  She is on singulair now for "years".  She also is on flovent diskus 1 puff twice a day since last September.  She reports she used to take Advair diskus when she lived in [place] before 2010.  She reports triggers are dust, cats (which she has), mold.  She also reports she has roaches at home.   She has been hospitalized for her asthma.  She was hospitalized last week.  Before that she was hospitalized around Christmas.  She reports hospitalization about ever 6 months or so.   Both recent hospitalization she received steroids.    She also reports having COPD that was diagnosed at hospitalization over Christmas.  She reports she has never smoked in lifetime.  She has a daughter who smokes outside.  She reports she was diagnosed with COPD during her hospitalization over Christmas.    She does report some sneezing and itchy watery eyes which are worse when the weather changes. She has used Claritin in the past of these symptoms.     Review of systems: Review of Systems  Constitutional: Negative for chills, fever and malaise/fatigue.  HENT: Negative for congestion, ear discharge, ear pain, nosebleeds, sinus pain and sore throat.    Eyes: Negative for discharge and redness.  Respiratory: Positive for cough, shortness of breath and wheezing. Negative for sputum production.   Cardiovascular: Negative for chest pain.  Gastrointestinal: Negative for abdominal pain, heartburn, nausea and vomiting.  Musculoskeletal: Negative for joint pain and myalgias.  Skin: Negative for itching and rash.  Neurological: Negative for headaches.    All other systems negative unless noted above in HPI  Past medical history: Past Medical History:  Diagnosis Date  . Asthma   . Diverticulosis   . History of breast cancer 2005  . Hypercholesteremia   . Hypertension     Past surgical history: Past Surgical History:  Procedure Laterality Date  . BILATERAL TOTAL MASTECTOMY WITH AXILLARY LYMPH NODE DISSECTION  2005   Surgery in [place] where patient was living at the time  . CHOLECYSTECTOMY  12/12/2014  . ERCP N/A 12/17/2014   Procedure: ENDOSCOPIC RETROGRADE CHOLANGIOPANCREATOGRAPHY (ERCP);  Surgeon: [name], MD;  Location[facility]OSCOPY;  Service: Endoscopy;  Laterality: N/A;    Family history:  Family History  Problem Relation Age of Onset  . Stomach cancer Mother   . Lung cancer Brother     Social history: She lives in a home with carpeting with electric heating and window cooling. There are 5 cats in the home. One of the cats testing in her bedroom. She there is concern for water damagesmildew as well as roaches. She is exposed to tobacco smoke. Her daughter.  She is unemployed.    Medication List: Allergies as of 07/30/2016   No Known Allergies     Medication List       Accurate as of 07/30/16 11:48 AM. Always use your most recent med list.          acetaminophen 325 MG tablet Commonly known as:  TYLENOL Take 1-2 tablets (325-650 mg total) by mouth every 4 (four) hours as needed for mild pain or moderate pain.   albuterol 108 (90 Base) MCG/ACT inhaler Commonly known as:  PROVENTIL HFA;VENTOLIN HFA Inhale 1  puff into the lungs every 6 (six) hours as needed for wheezing or shortness of breath.   albuterol (2.5 MG/3ML) 0.083% nebulizer solution Commonly known as:  PROVENTIL Take 2.5 mg by nebulization every 4 (four) hours as needed for wheezing or shortness of breath.   alendronate 70 MG tablet Commonly known as:  FOSAMAX TAKE 1 TABLET WEEKLY   atorvastatin 20 MG tablet Commonly known as:  LIPITOR Take 1 tablet (20 mg total) by mouth daily at 6 PM.   FLOVENT DISKUS 100 MCG/BLIST Aepb Generic drug:  Fluticasone Propionate (Inhal) Inhale 1 puff into the lungs 2 (two) times daily.   lisinopril 20 MG tablet Commonly known as:  PRINIVIL,ZESTRIL Take 20 mg by mouth daily.   loratadine 10 MG tablet Commonly known as:  CLARITIN Take 10 mg by mouth daily.   montelukast 10 MG tablet Commonly known as:  SINGULAIR Take 10 mg by mouth daily.   omeprazole 20 MG capsule Commonly known as:  PRILOSEC Take 20 mg by mouth daily.   Vitamin D (Ergocalciferol) 50000 units Caps capsule Commonly known as:  DRISDOL Take by mouth.   Vitamin D3 5000 units Caps Take 1 capsule by mouth daily.       Known medication allergies: No Known Allergies   Physical examination: Blood pressure 120/82, pulse 88, temperature 98.6 F (37 C), temperature source Oral, resp. rate (!) 22, height 5' 0.12" (1.527 m), weight 149 lb 12.8 oz (67.9 kg).  General: Alert, interactive, in no acute distress. HEENT: TMs pearly gray, turbinates mildly edematous without discharge, post-pharynx non erythematous. Neck: Supple without lymphadenopathy. Lungs: Decreased breath sounds bilaterally without wheezing, rhonchi or rales. {no increased work of breathing.Mild improvement in aeration following DuoNeb treatment CV: Normal S1, S2 without murmurs. Abdomen: Nondistended, nontender.  Skin: Warm and dry, without lesions or rashes. Extremities:  No clubbing, cyanosis or edema. Neuro:   Grossly  intact.  Diagnositics/Labs: Labs/imaging:  Chest x-ray report from 07/25/2016 (image is not available) radiology impression probable hiatal hernia. No acute abnormalities.  CBC with differential from 07/26/2011 largely within normal limits  Spirometry: FEV1: 1.06L  50%, FVC: 2.15L  78%, following DuoNeb she had improvement in FEV1 to 1.21 L   Allergy testing: Deferred today due to reduced lung function  Assessment and plan:   Asthma, severe persistent    - at this time symptoms are poorly controlled    - continue Singulair 10mg  daily (take at bedtime)    - stop Flovent diskus    - start Symbicort 2 puffs twice a day    - use inhalers with spacer    - obtain following labs: cbc w diff, total IgE and environmental panel    - She will likely benefit from a biologic agent to better manage her asthma.  Rhinoconjunctivitis  - Presumed allergic nature  - We'll obtain environmental allergy panel as above  - She will continue Singulair and may  continue use of Claritin 10 mg as needed  Follow-up 2-3 months   I appreciate the opportunity to take part in Valerie Sellers's care. Please do not hesitate to contact me with questions.  Sincerely,   Margo AyeShaylar Balin Vandegrift, MD Allergy/Immunology [facility]

## 2016-07-30 NOTE — Patient Instructions (Addendum)
Asthma    - at this time symptoms are not well controlled    - continue Singulair 10mg  daily (take at bedtime)    - stop Flovent diskus    - start Symbicort 160mcg 2 puffs twice a day    - use inhalers with spacer    - obtain following labs: cbc w diff, total IgE and environmental panel  Follow-up 2-3 months

## 2016-08-03 LAB — ALLERGENS W/TOTAL IGE AREA 2
Cat Dander IgE: <0.1 kU/L
Cockroach, German IgE: 2.17 kU/L — AB
Common Silver Birch IgE: 0.15 kU/L — AB
Cottonwood IgE: 0.35 kU/L — AB
D Farinae IgE: <0.1 kU/L
D Pteronyssinus IgE: <0.1 kU/L
Elm, American IgE: 0.46 kU/L — AB
G002-IGE BERMUDA GRASS: 0.43 kU/L — AB
G010-IGE JOHNSON GRASS: 0.45 kU/L — AB
IGE (IMMUNOGLOBULIN E), SERUM: 83 [IU]/mL (ref 0–100)
Maple/Box Elder IgE: 0.46 kU/L — AB
Mouse Urine IgE: <0.1 kU/L
Oak, White IgE: 0.46 kU/L — AB
Pecan, Hickory IgE: 0.32 kU/L — AB
Penicillium Chrysogen IgE: <0.1 kU/L
Ragweed, Short IgE: 0.51 kU/L — AB
Sheep Sorrel IgE Qn: 0.48 kU/L — AB
T006-IGE CEDAR, MOUNTAIN: 0.29 kU/L — AB
Timothy Grass IgE: 0.44 kU/L — AB
W014-IGE PIGWEED, ROUGH: 0.42 kU/L — AB
WHITE MULBERRY IGE: 0.3 kU/L — AB

## 2016-08-03 LAB — CBC WITH DIFFERENTIAL/PLATELET
BASOS ABS: 0 10*3/uL (ref 0.0–0.2)
Basos: 0 %
EOS (ABSOLUTE): 0.2 10*3/uL (ref 0.0–0.4)
EOS: 3 %
HEMATOCRIT: 44.7 % (ref 34.0–46.6)
HEMOGLOBIN: 15.2 g/dL (ref 11.1–15.9)
IMMATURE GRANS (ABS): 0 10*3/uL (ref 0.0–0.1)
Immature Granulocytes: 0 %
LYMPHS ABS: 1.3 10*3/uL (ref 0.7–3.1)
LYMPHS: 20 %
MCH: 31.1 pg (ref 26.6–33.0)
MCHC: 34 g/dL (ref 31.5–35.7)
MCV: 92 fL (ref 79–97)
Monocytes Absolute: 0.5 10*3/uL (ref 0.1–0.9)
Monocytes: 8 %
NEUTROS ABS: 4.5 10*3/uL (ref 1.4–7.0)
Neutrophils: 69 %
Platelets: 223 10*3/uL (ref 150–379)
RBC: 4.88 x10E6/uL (ref 3.77–5.28)
RDW: 13.6 % (ref 12.3–15.4)
WBC: 6.5 10*3/uL (ref 3.4–10.8)

## 2016-09-26 IMAGING — US US TRANSVAGINAL NON-OB
1 series · 13 of 25 positions shown · non-contrast
Comparison: None

CLINICAL DATA: Healed patient with history of pelvic mass.

EXAM:
TRANSABDOMINAL AND TRANSVAGINAL ULTRASOUND OF PELVIS
TECHNIQUE: Both transabdominal and transvaginal ultrasound examinations of the
pelvis were performed. Transabdominal technique was performed for
global imaging of the pelvis including uterus, ovaries, adnexal
regions, and pelvic cul-de-sac. It was necessary to proceed with
endovaginal exam following the transabdominal exam to visualize the
adnexal structures.

[Series 1: us transvaginal non-ob · 0.27mm/px · 13 of 85 slices shown]
[im 1/85]
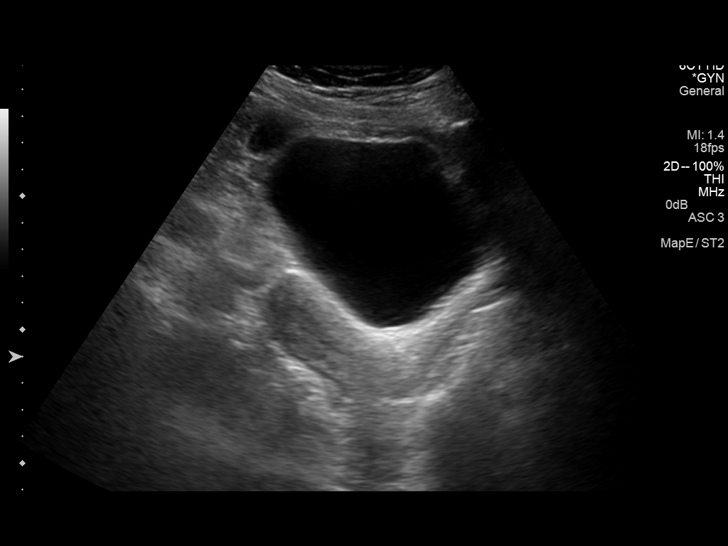
[im 8/85]
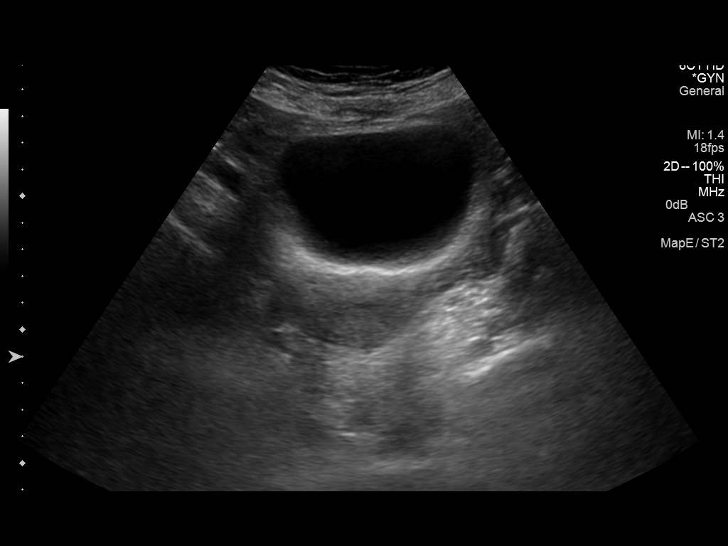
[im 15/85]
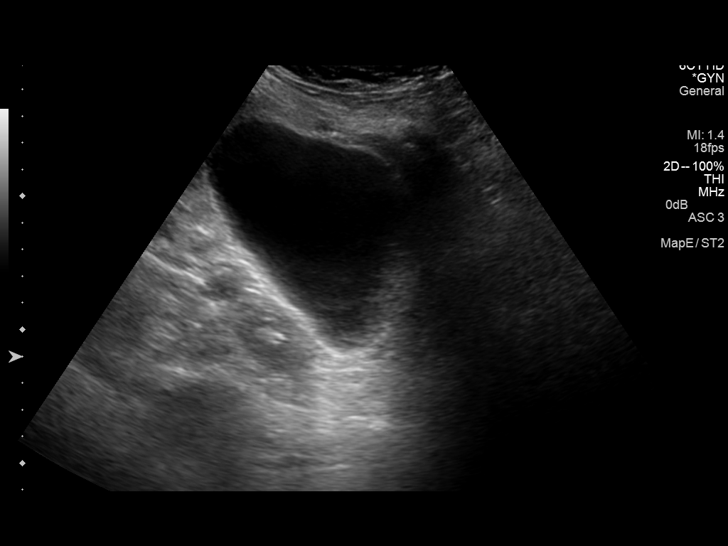
[im 22/85]
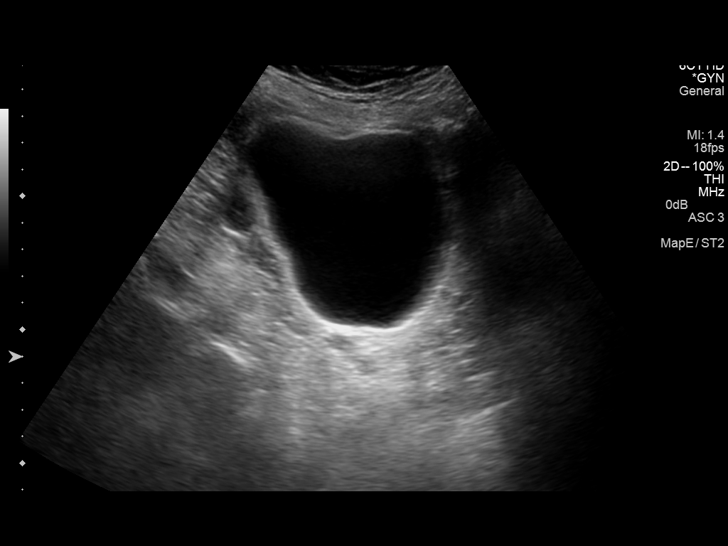
[im 29/85]
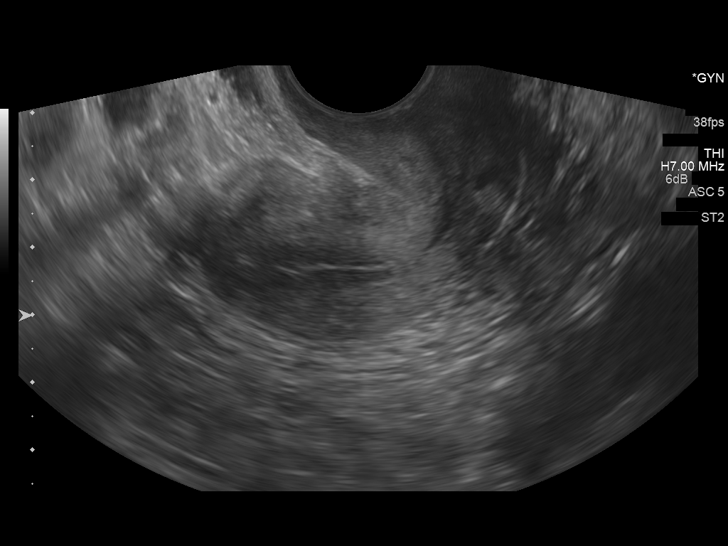
[im 36/85]
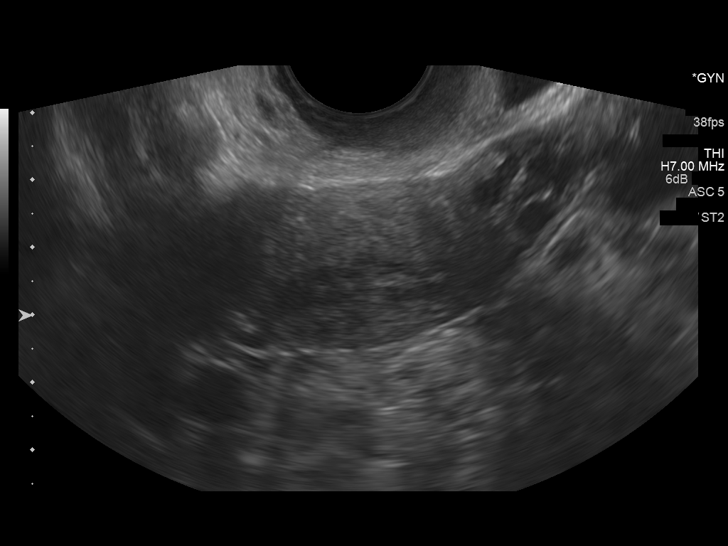
[im 43/85]
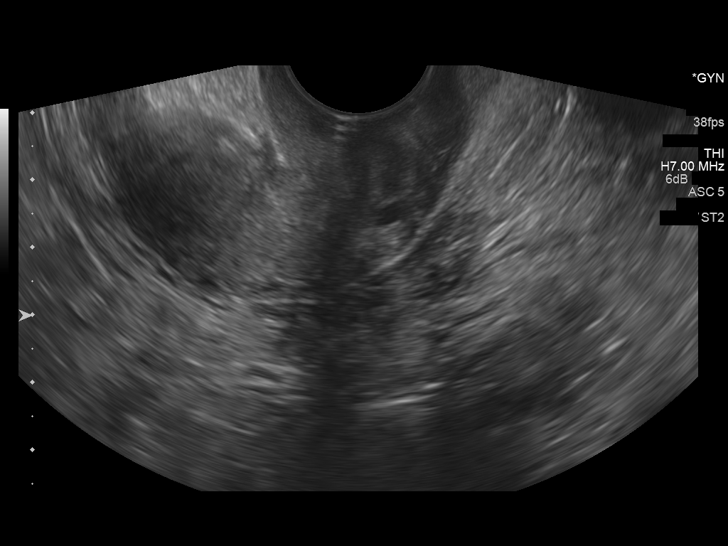
[im 50/85]
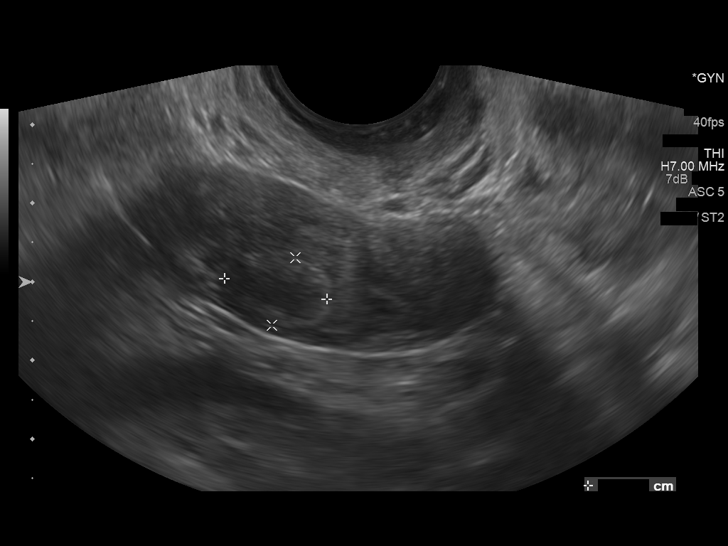
[im 57/85]
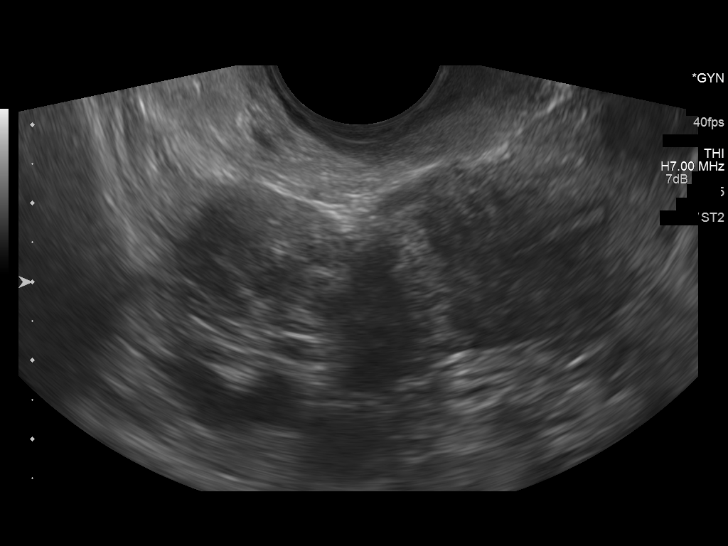
[im 64/85]
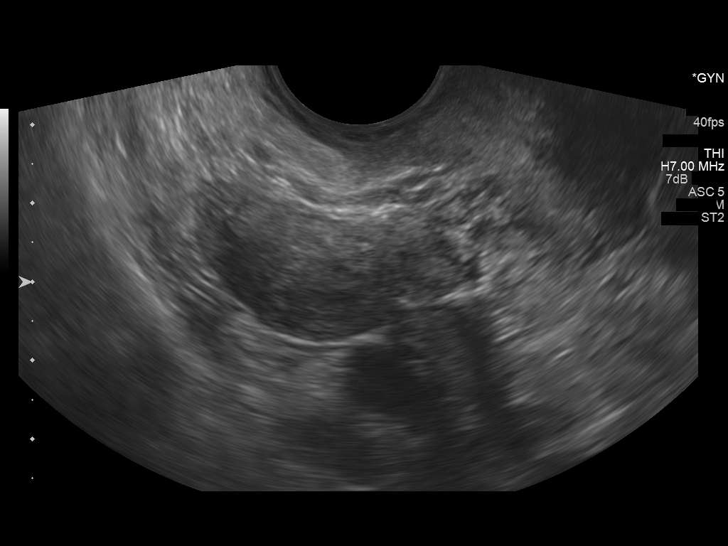
[im 71/85]
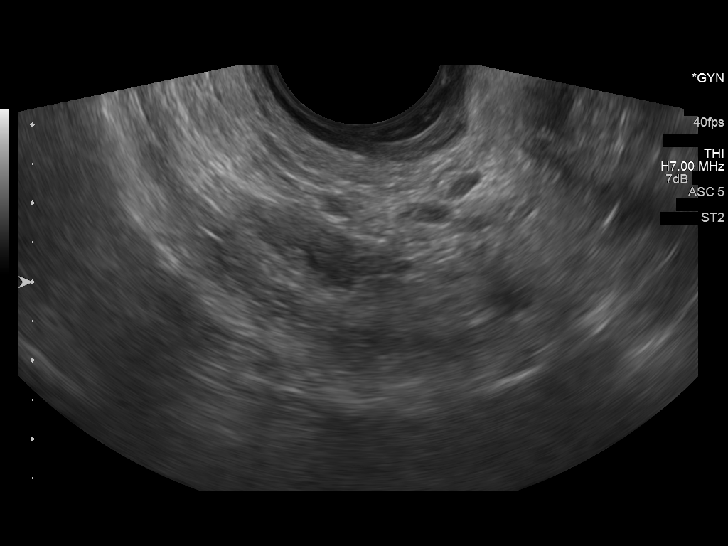
[im 78/85]
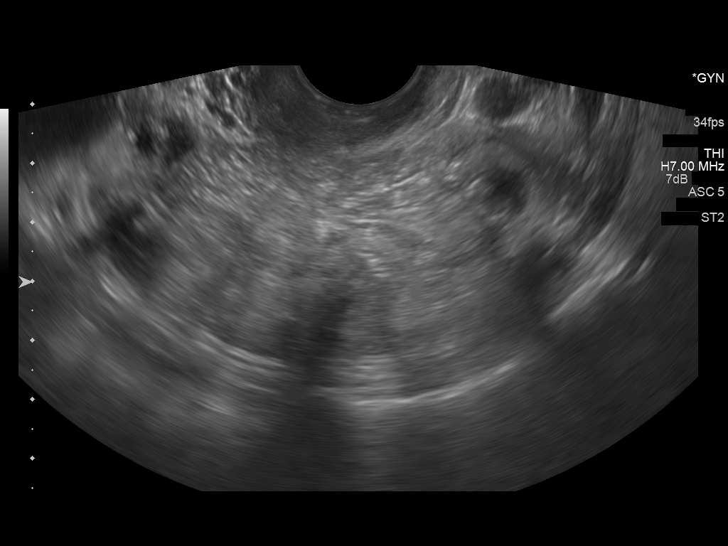
[im 85/85]
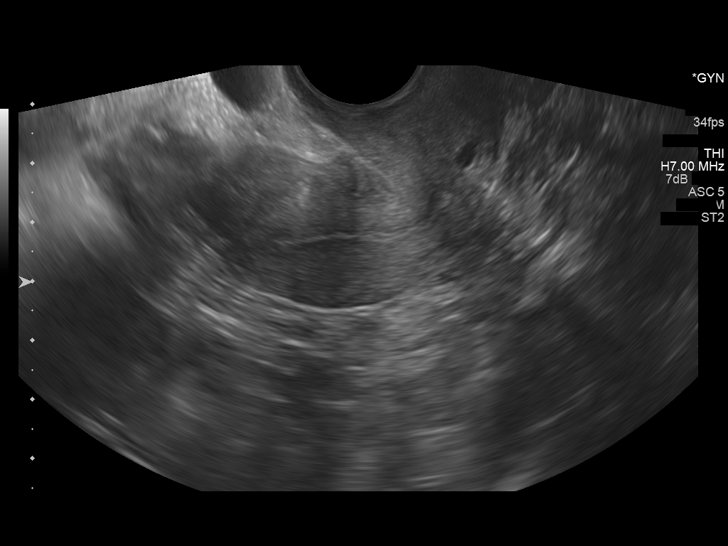

[13 of 25 positions shown; findings below may reference images not displayed]

FINDINGS: Uterus

Measurements: 5.3 x 2.9 x 4.1 cm. No fibroids or other mass
visualized.

Endometrium

Thickness: 4 mm.  No focal abnormality visualized.

Right ovary

Within the right adnexa there is a 4.7 x 1.9 x 2.5 cm hypoechoic
mass. Centrally within this mass is a more focal 1.3 x 0.8 x 0.9 cm
predominately hyperechoic area.

Left ovary

Not visualized.

Other findings

No free fluid.
IMPRESSION: Hypoechoic mass within the right adnexa with a more centrally
located area of increased echogenicity. It is unclear if this
represents an abnormal appearance of the right ovary within internal
hyperechoic mass suggestive of a dermoid, or if the entire mass is
abnormal. Recommend further evaluation with pelvic MRI. If no pelvic
MRI is obtained, at a minimum, follow-up pelvic ultrasound is
recommended in 3 months.

## 2016-10-01 ENCOUNTER — Ambulatory Visit (INDEPENDENT_AMBULATORY_CARE_PROVIDER_SITE_OTHER): Payer: 59 | Admitting: Allergy

## 2016-10-01 ENCOUNTER — Encounter: Payer: Self-pay | Admitting: Allergy

## 2016-10-01 VITALS — BP 130/80 | HR 80 | Resp 16

## 2016-10-01 DIAGNOSIS — J4550 Severe persistent asthma, uncomplicated: Secondary | ICD-10-CM

## 2016-10-01 DIAGNOSIS — J3089 Other allergic rhinitis: Secondary | ICD-10-CM | POA: Diagnosis not present

## 2016-10-01 MED ORDER — OLOPATADINE HCL 0.2 % OP SOLN: 1.0000 [drp] | Freq: Every day | OPHTHALMIC | 5 refills | Status: AC | PRN

## 2016-10-01 NOTE — Patient Instructions (Addendum)
Asthma with COPD overlap    - at this time symptoms are not well controlled    - continue Singulair  daily (take at bedtime)    - continue Symbicort 2 puffs twice a day    - use albuterol inhaler 2 puffs every 4-6 hours as needed for cough, wheezing, difficulty breathing or chest tightness    - use inhalers with spacer    - will start approval process for Nucala, a monthly injection, to help better control your asthma.    Asthma control goals:   Full participation in all desired activities (may need albuterol before activity)  Albuterol use two time or less a week on average (not counting use with activity)  Cough interfering with sleep two time or less a month  Oral steroids no more than once a year  No hospitalizations      Allergic rhinitis     - continue allergen avoidance for tree, weed and grass pollen as well as cockroach.     - continue Claritin  daily     - will prescribe Pataday 1 drop each eye as needed for itchy, watery, red eyes  Follow-up 4 months

## 2016-10-01 NOTE — Progress Notes (Signed)
Follow-up Note  RE: Valerie Sellers MRN: [id number] DOB: [birth year] Date of Office Visit: 10/01/2016   History of present illness: Valerie Sellers is a 61 y.o. 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  She was told after last hospitalization this past Christmas that she had COPD as well however she has no smoking history.  She was last seen on 07/30/16 for initial visit at which time I changed her to Symbicort from Flovent Diskus. She is using 1 puff twice a of Symbicort.  She continues to take Singulair daily.  She uses albuterol 2 times a day which she says is about the same as last visit.  She did go to the ED for asthma flare in early April where she reports she got breathing treatments and steroids and able to discharge home.    With her allergies she does take Claritin along with her Singulair as above.  She reports occasional sneezing but reports her allergy symptoms are "not bad".  She does report she takes benadryl about once a day which she says she takes for itchy eyes.     Review of systems: Review of Systems  Constitutional: Negative for chills, fever and malaise/fatigue.  HENT: Positive for congestion. Negative for ear discharge, ear pain, nosebleeds, sinus pain, sore throat and tinnitus.   Eyes: Negative for pain, discharge and redness.  Respiratory: Positive for cough, shortness of breath and wheezing. Negative for sputum production.   Cardiovascular: Negative for chest pain.  Gastrointestinal: Negative for abdominal pain, diarrhea, nausea and vomiting.  Musculoskeletal: Negative for joint pain and myalgias.  Skin: Negative for itching and rash.  Neurological: Negative for headaches.    All other systems negative unless noted above in HPI  Past medical/social/surgical/family history have been reviewed and are unchanged unless specifically indicated below.  No changes  Medication List: Allergies as of 10/01/2016   No Known Allergies     Medication  List       Accurate as of 10/01/16  9:17 AM. Always use your most recent med list.          acetaminophen 325 MG tablet Commonly known as:  TYLENOL Take 1-2 tablets (325-650 mg total) by mouth every 4 (four) hours as needed for mild pain or moderate pain.   albuterol 108 (90 Base) MCG/ACT inhaler Commonly known as:  PROVENTIL HFA;VENTOLIN HFA Inhale 1 puff into the lungs every 6 (six) hours as needed for wheezing or shortness of breath.   albuterol (2.5 MG/3ML) 0.083% nebulizer solution Commonly known as:  PROVENTIL Take 2.5 mg by nebulization every 4 (four) hours as needed for wheezing or shortness of breath.   alendronate 70 MG tablet Commonly known as:  FOSAMAX TAKE 1 TABLET WEEKLY   atorvastatin 20 MG tablet Commonly known as:  LIPITOR Take 1 tablet (20 mg total) by mouth daily at 6 PM.   budesonide-formoterol 160-4.5 MCG/ACT inhaler Commonly known as:  SYMBICORT Inhale 1 puff into the lungs 2 (two) times daily.   lisinopril 20 MG tablet Commonly known as:  PRINIVIL,ZESTRIL Take 20 mg by mouth daily.   loratadine 10 MG tablet Commonly known as:  CLARITIN Take 10 mg by mouth daily.   montelukast 10 MG tablet Commonly known as:  SINGULAIR Take 10 mg by mouth daily.   omeprazole 20 MG capsule Commonly known as:  PRILOSEC Take 20 mg by mouth daily.   Vitamin D3 5000 units Caps Take 1 capsule by mouth daily.  Known medication allergies: No Known Allergies   Physical examination: Blood pressure 130/80, pulse 80, resp. rate 16.  General: Alert, interactive, in no acute distress. HEENT: PERRLA, TMs pearly gray, turbinates mildly edematous without discharge, post-pharynx non erythematous. Neck: Supple without lymphadenopathy. Lungs: Clear to auscultation without wheezing, rhonchi or rales. {no increased work of breathing. CV: Normal S1, S2 without murmurs. Abdomen: Nondistended, nontender. Skin: Warm and dry, without lesions or rashes. Extremities:   No clubbing, cyanosis or edema. Neuro:   Grossly intact.  Diagnositics/Labs: Labs:  Component     Latest Ref Rng & Units 07/30/2016  IgE (Immunoglobulin E), Serum     0 - 100 IU/mL 83  D Pteronyssinus IgE     Class 0 kU/L <0.10  D Farinae IgE     Class 0 kU/L <0.10  Cat Dander IgE     Class 0 kU/L <0.10  Dog Dander IgE     Class 0 kU/L <0.10  [place] Grass IgE     Class I kU/L 0.43 (A)  Timothy Grass IgE     Class I kU/L 0.44 (A)  [place] Grass IgE     Class I kU/L 0.45 (A)  Cockroach, German IgE     Class III kU/L 2.17 (A)  Penicillium Chrysogen IgE     Class 0 kU/L <0.10  Cladosporium Herbarum IgE     Class 0 kU/L <0.10  Aspergillus Fumigatus IgE     Class 0 kU/L <0.10  Alternaria Alternata IgE     Class 0 kU/L <0.10  Maple/Box Elder IgE     Class I kU/L 0.46 (A)  Common Silver Charletta Cousin IgE     Class 0/I kU/L 0.15 (A)  Cedar, Mountain IgE     Class 0/I kU/L 0.29 (A)  Oak, White IgE     Class I kU/L 0.46 (A)  Elm, American IgE     Class I kU/L 0.46 (A)  Cottonwood IgE     Class I kU/L 0.35 (A)  Pecan, Hickory IgE     Class I kU/L 0.32 (A)  White Mulberry IgE     Class 0/I kU/L 0.30 (A)  Ragweed, Short IgE     Class I kU/L 0.51 (A)  Pigweed, Rough IgE     Class I kU/L 0.42 (A)  Sheep Sorrel IgE Qn     Class I kU/L 0.48 (A)  Mouse Urine IgE     Class 0 kU/L <0.10   Component     Latest Ref Rng & Units 07/30/2016  WBC     3.4 - 10.8 x10E3/uL 6.5  RBC     3.77 - 5.28 x10E6/uL 4.88  Hemoglobin     11.1 - 15.9 g/dL 16.1  HCT     09.6 - 04.5 % 44.7  MCV     79 - 97 fL 92  MCH     26.6 - 33.0 pg 31.1  MCHC     31.5 - 35.7 g/dL 40.9  RDW     81.1 - 91.4 % 13.6  Platelets     150 - 379 x10E3/uL 223  Neutrophils     Not Estab. % 69  Lymphs     Not Estab. % 20  Monocytes     Not Estab. % 8  Eos     Not Estab. % 3  Basos     Not Estab. % 0  NEUT#     1.4 - 7.0 x10E3/uL 4.5  Lymphocyte #  0.7 - 3.1 x10E3/uL 1.3  Monocytes Absolute     0.1  - 0.9 x10E3/uL 0.5  EOS (ABSOLUTE)     0.0 - 0.4 x10E3/uL 0.2  Basophils Absolute     0.0 - 0.2 x10E3/uL 0.0  Immature Granulocytes     Not Estab. % 0  Immature Grans (Abs)     0.0 - 0.1 x10E3/uL 0.0    Spirometry: FEV1: 1.1L  52%, FVC: 1.73L  63%, ratio consistent with mixed obstruction with restrictive pattern.  Spirometry is stable from last visit  Assessment and plan:   Asthma (severe persistent) with COPD overlap    - at this time symptoms are not well controlled    - continue Singulair  daily (take at bedtime)    - continue Symbicort 2 puffs twice a day    - use albuterol inhaler 2 puffs every 4-6 hours as needed for cough, wheezing, difficulty breathing or chest tightness    - use inhalers with spacer    - will start approval process for Nucala to help better control of her asthma. She has adequate level of eosinophils to proceed with Nucala therapy.      Asthma control goals:   Full participation in all desired activities (may need albuterol before activity)  Albuterol use two time or less a week on average (not counting use with activity)  Cough interfering with sleep two time or less a month  Oral steroids no more than once a year  No hospitalizations      Allergic rhinitis     - continue allergen avoidance for tree, weed and grass pollen as well as cockroach.     - continue Claritin  daily     - will prescribe Pataday 1 drop each eye as needed for itchy, watery, red eyes  Follow-up 4 months   I appreciate the opportunity to take part in Chidinma's care. Please do not hesitate to contact me with questions.  Sincerely,   Margo Aye, MD Allergy/Immunology [facility]

## 2016-11-07 ENCOUNTER — Other Ambulatory Visit: Payer: Self-pay | Admitting: Allergy and Immunology

## 2016-11-07 MED ORDER — BUDESONIDE-FORMOTEROL FUMARATE 160-4.5 MCG/ACT IN AERO: 1.0000 | INHALATION_SPRAY | Freq: Two times a day (BID) | RESPIRATORY_TRACT | 4 refills | Status: AC

## 2016-11-07 NOTE — Telephone Encounter (Signed)
Valerie Sellers would like a refill of her SYMBICORT please.

## 2016-11-26 ENCOUNTER — Ambulatory Visit (INDEPENDENT_AMBULATORY_CARE_PROVIDER_SITE_OTHER): Payer: 59

## 2016-11-26 ENCOUNTER — Other Ambulatory Visit: Payer: Self-pay

## 2016-11-26 ENCOUNTER — Other Ambulatory Visit: Payer: Self-pay | Admitting: Allergy

## 2016-11-26 DIAGNOSIS — J4550 Severe persistent asthma, uncomplicated: Secondary | ICD-10-CM | POA: Diagnosis not present

## 2016-11-26 MED ORDER — EPINEPHRINE 0.3 MG/0.3ML IJ SOAJ: 0.3000 mg | Freq: Once | INTRAMUSCULAR | 3 refills | Status: AC

## 2016-11-26 MED ORDER — EPINEPHRINE 0.3 MG/0.3ML IJ SOAJ: 0.3000 mg | Freq: Once | INTRAMUSCULAR | 2 refills | Status: AC

## 2016-11-26 NOTE — Progress Notes (Signed)
Pt started Nucala 100 mg, to be given every 4 weeks sq. All instructions reviewed and pt has EpiPen. Wait time for 1st administration is 1 hour. No problems noted

## 2016-11-27 MED ORDER — MEPOLIZUMAB 100 MG ~~LOC~~ SOLR
100.0000 mg | SUBCUTANEOUS | Status: AC
  Administered 2016-11-26: 100 mg via SUBCUTANEOUS

## 2016-11-27 NOTE — Addendum Note (Signed)
Addended by: Devoria GlassingVONCANNON, TAMMY M on: 11/27/2016 11:26 AM   Modules accepted: Orders

## 2017-01-01 DEATH — deceased

## 2017-02-04 ENCOUNTER — Ambulatory Visit: Payer: 59 | Admitting: Allergy
